# Patient Record
Sex: Female | Born: 2000
Health system: Southern US, Community
[De-identification: ages and names within clinical notes are randomized; demographics above are authoritative.]

## PROBLEM LIST (undated history)

## (undated) DIAGNOSIS — T8859XA Other complications of anesthesia, initial encounter: Secondary | ICD-10-CM

## (undated) DIAGNOSIS — T783XXA Angioneurotic edema, initial encounter: Secondary | ICD-10-CM

## (undated) HISTORY — DX: Angioneurotic edema, initial encounter: T78.3XXA

---
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
## 2018-07-03 DIAGNOSIS — Z23 Encounter for immunization: Secondary | ICD-10-CM | POA: Diagnosis not present

## 2018-07-03 DIAGNOSIS — Z0000 Encounter for general adult medical examination without abnormal findings: Secondary | ICD-10-CM | POA: Diagnosis not present

## 2019-07-25 ENCOUNTER — Other Ambulatory Visit: Payer: Self-pay

## 2019-07-25 DIAGNOSIS — K624 Stenosis of anus and rectum: Secondary | ICD-10-CM

## 2019-07-26 LAB — NOVEL CORONAVIRUS, NAA: SARS-CoV-2, NAA: NOT DETECTED

## 2019-07-27 ENCOUNTER — Telehealth: Payer: Self-pay | Admitting: General Practice

## 2019-07-27 NOTE — Telephone Encounter (Signed)
Negative COVID results given. Patient results "NOT Detected." Caller expressed understanding. ° °

## 2019-09-28 HISTORY — PX: WISDOM TOOTH EXTRACTION: SHX21

## 2019-09-28 HISTORY — PX: BUNIONECTOMY: SHX129

## 2020-02-20 ENCOUNTER — Other Ambulatory Visit: Payer: Self-pay

## 2020-02-20 ENCOUNTER — Encounter: Payer: Self-pay | Admitting: Podiatry

## 2020-02-20 ENCOUNTER — Ambulatory Visit (INDEPENDENT_AMBULATORY_CARE_PROVIDER_SITE_OTHER): Payer: 59

## 2020-02-20 ENCOUNTER — Ambulatory Visit: Payer: 59 | Admitting: Podiatry

## 2020-02-20 VITALS — Temp 97.8°F

## 2020-02-20 DIAGNOSIS — M21619 Bunion of unspecified foot: Secondary | ICD-10-CM

## 2020-02-20 DIAGNOSIS — Q666 Other congenital valgus deformities of feet: Secondary | ICD-10-CM | POA: Diagnosis not present

## 2020-02-20 DIAGNOSIS — M79671 Pain in right foot: Secondary | ICD-10-CM | POA: Diagnosis not present

## 2020-02-20 DIAGNOSIS — M21612 Bunion of left foot: Secondary | ICD-10-CM | POA: Diagnosis not present

## 2020-02-20 DIAGNOSIS — M21611 Bunion of right foot: Secondary | ICD-10-CM | POA: Diagnosis not present

## 2020-02-23 ENCOUNTER — Encounter: Payer: Self-pay | Admitting: Podiatry

## 2020-02-25 ENCOUNTER — Encounter: Payer: Self-pay | Admitting: Podiatry

## 2020-02-25 NOTE — Progress Notes (Signed)
Subjective:  Patient ID: Debra Boyer, female    DOB: 30-Sep-2000,  MRN: [id number]  Chief Complaint  Patient presents with  . Foot Problem    R dorsal midfoot. x1 month. Pt stated, "I woke up one morning and noticed that the area was painful and swollen. Pain varies with activity - it can be 6/10".  . Foot Pain    Bilateral plantar midfoot and medial forefoot (submet 1). 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 Difficult to wear shoes".    19 y.o. female presents with the above complaint.  Patient presents with complaints of bilateral bunion pain that has been causing her a lot of pain.  Patient states that his pain is 6 out of 10 dull but varies with activities.  She states the bump on the side of the foot hurts her a lot.  It is difficult to wear shoes.  She states the pain is severe after work.  She would like to discuss what her treatment options.  She denies any other acute complaints.  She has not seen anyone else prior to seeing me.  She has not tried any other conservative therapy including orthotics.  Review of Systems: Negative except as noted in the HPI. Denies N/V/F/Ch.  No past medical history on file.  Current Outpatient Medications:  .  FLUoxetine (PROZAC) 20 MG capsule, Take 20 mg by mouth daily., Disp: , Rfl:   Social History   Tobacco Use  Smoking Status Never Smoker  Smokeless Tobacco Never Used    Not on File Objective:   Vitals:   02/20/20 0845  Temp: 97.8 F (36.6 C)   There is no height or weight on file to calculate BMI. Constitutional Well developed. Well nourished.  Vascular Dorsalis pedis pulses palpable bilaterally. Posterior tibial pulses palpable bilaterally. Capillary refill normal to all digits.  No cyanosis or clubbing noted. Pedal hair growth normal.  Neurologic Normal speech. Oriented to person, place, and time. Epicritic sensation to light touch grossly present bilaterally.  Dermatologic Nails well  groomed and normal in appearance. No open wounds. No skin lesions.  Orthopedic: Normal joint ROM without pain or crepitus bilaterally. Hallux abductovalgus deformity present Left 1st MPJ diminished range of motion. Left 1st TMT without gross hypermobility. Right 1st MPJ diminished range of motion  Right 1st TMT without gross hypermobility. Lesser digital contractures absent bilaterally.   Radiographs: Taken and reviewed. 3 views of skeletally mature adult bilateral foot with the right greater than left moderate bunion deformity noted bilaterally.  There is increase in intermetatarsal angle bilaterally there is any increasing hallux valgus angles.  Sesamoid position is 6 out of 7.  No elevatus present bilaterally.  There is mild decreasing calcaneal clinician angle increase in talar declination angle anterior break in cyma line no elevatus present.  These findings are consistent for pes planus.  Metatarsal parabola is intact bilaterally  Assessment:   1. Bunion   2. Right foot pain    Plan:  Patient was evaluated and treated and all questions answered.  Hallux abductovalgus deformity bilaterally with right greater than left,  -XR as above. -I explained patient the etiology of bunion deformity and various treatment options were extensively discussed.  I explained to the patient that the underlying driving force for bunions are pes planus deformity.  Patient states understanding.  I believe she will benefit from custom-made orthotics to help address the pes planus.  I also discussed with her briefly the surgical  management of bunions and if her pain continues to get worse will discuss surgical management of bunion deformity with right side before the left side.  Patient will be scheduled to see [facility].  Return if symptoms worsen or fail to improve, for See Liliane Channel for orthotics ASAP.

## 2020-02-26 ENCOUNTER — Other Ambulatory Visit: Payer: Self-pay | Admitting: Podiatry

## 2020-02-26 DIAGNOSIS — M21619 Bunion of unspecified foot: Secondary | ICD-10-CM

## 2020-03-10 ENCOUNTER — Ambulatory Visit: Payer: 59 | Admitting: Orthotics

## 2020-03-10 ENCOUNTER — Other Ambulatory Visit: Payer: Self-pay

## 2020-03-10 DIAGNOSIS — M2141 Flat foot [pes planus] (acquired), right foot: Secondary | ICD-10-CM

## 2020-03-10 DIAGNOSIS — Q666 Other congenital valgus deformities of feet: Secondary | ICD-10-CM

## 2020-03-10 DIAGNOSIS — M21619 Bunion of unspecified foot: Secondary | ICD-10-CM

## 2020-03-10 DIAGNOSIS — M79671 Pain in right foot: Secondary | ICD-10-CM

## 2020-03-10 DIAGNOSIS — M2142 Flat foot [pes planus] (acquired), left foot: Secondary | ICD-10-CM

## 2020-03-10 NOTE — Progress Notes (Signed)
Cast today for foot orthotics to address hallux valgus/pes planus condition.  Plan of soft device that will offer RF stability, longitudinal arch support.

## 2020-03-17 ENCOUNTER — Ambulatory Visit: Payer: 59 | Admitting: Podiatry

## 2020-03-17 ENCOUNTER — Other Ambulatory Visit: Payer: Self-pay

## 2020-03-17 VITALS — Temp 97.2°F

## 2020-03-17 DIAGNOSIS — M2011 Hallux valgus (acquired), right foot: Secondary | ICD-10-CM

## 2020-03-17 DIAGNOSIS — Z01818 Encounter for other preprocedural examination: Secondary | ICD-10-CM | POA: Diagnosis not present

## 2020-03-17 DIAGNOSIS — M79671 Pain in right foot: Secondary | ICD-10-CM

## 2020-03-18 ENCOUNTER — Encounter: Payer: Self-pay | Admitting: Podiatry

## 2020-03-18 NOTE — Progress Notes (Signed)
Subjective:  Patient ID: Debra Boyer, female    DOB: May 19, 2001,  MRN: [id number]  Chief Complaint  Patient presents with  . Follow-up    Pt stated, "Pain is the same". Pt would like to discuss surgery.    19 y.o. female presents with the above complaint.  Patient presents with a follow-up of bilateral bunion deformity with right greater than left.  Patient states her pain has gotten progressively worse.  She states is painful when walking on the surface.  She has tried shoe gear modification which has not helped.  She is working with orthotics as well which has not helped a little bit.  She denies any other acute complaints.  She would like to discuss surgery as she has failed all conservative options.  Review of Systems: Negative except as noted in the HPI. Denies N/V/F/Ch.  No past medical history on file.  Current Outpatient Medications:  .  FLUoxetine (PROZAC) 20 MG capsule, Take 20 mg by mouth daily., Disp: , Rfl:   Social History   Tobacco Use  Smoking Status Never Smoker  Smokeless Tobacco Never Used    Not on File Objective:   Vitals:   03/17/20 1322  Temp: (!) 97.2 F (36.2 C)   There is no height or weight on file to calculate BMI. Constitutional Well developed. Well nourished.  Vascular Dorsalis pedis pulses palpable bilaterally. Posterior tibial pulses palpable bilaterally. Capillary refill normal to all digits.  No cyanosis or clubbing noted. Pedal hair growth normal.  Neurologic Normal speech. Oriented to person, place, and time. Epicritic sensation to light touch grossly present bilaterally.  Dermatologic Nails well groomed and normal in appearance. No open wounds. No skin lesions.  Orthopedic: Normal joint ROM without pain or crepitus bilaterally. Hallux abductovalgus deformity present no intra-articular pain noted.  This is a tracking out of track bound deformity Left 1st MPJ diminished range of motion. Left 1st TMT without gross  hypermobility. Right 1st MPJ diminished range of motion  Right 1st TMT without gross hypermobility. Lesser digital contractures absent bilaterally.   Radiographs: Taken and reviewed. 3 views of skeletally mature adult bilateral foot with the right greater than left moderate bunion deformity noted bilaterally.  There is increase in intermetatarsal angle bilaterally there is any increasing hallux valgus angles.  Sesamoid position is 6 out of 7.  No elevatus present bilaterally.  There is mild decreasing calcaneal clinician angle increase in talar declination angle anterior break in cyma line no elevatus present.  These findings are consistent for pes planus.  Metatarsal parabola is intact bilaterally  Assessment:   1. Hallux abducto valgus, right   2. Right foot pain   3. Preoperative examination    Plan:  Patient was evaluated and treated and all questions answered.  Hallux abductovalgus deformity bilaterally with right greater than left,  -XR as above. -I explained patient the etiology of bunion deformity and various treatment options were extensively discussed.  I explained to the patient that the underlying driving force for bunions are pes planus deformity.  Given that patient's pain is continued to get worse with the underlying bunion deformity I believe patient will benefit from a surgical intervention to help correct the bunion deformity.  I will focus on the right side first and then the left side.  I discussed with the patient my surgical planning as well as my postop protocol.  Patient will be weightbearing as tolerated with a cam boot on. -Informed surgical risk consent was reviewed and read aloud  to the patient.  I reviewed the films.  I have discussed my findings with the patient in great detail.  I have discussed all risks including but not limited to infection, stiffness, scarring, limp, disability, deformity, damage to blood vessels and nerves, numbness, poor healing, need for  braces, arthritis, chronic pain, amputation, death.  All benefits and realistic expectations discussed in great detail.  I have made no promises as to the outcome.  I have provided realistic expectations.  I have offered the patient a 2nd opinion, which they have declined and assured me they preferred to proceed despite the risks -A total of 35 minutes was spent in direct patient care as well as pre and post patient encounter activities.  This includes documentation as well as reviewing patient chart for labs, imaging, past medical, surgical, social, and family history as documented in the EMR.  I have reviewed medication allergies as documented in EMR.  I discussed the etiology of condition and treatment options from conservative to surgical care.  All risks and benefit of the treatment course was discussed in detail.  All questions were answered and return appointment was discussed.  Since the visit completed in an ambulatory/outpatient setting, the patient and/or parent/guardian has been advised to contact the providers office for worsening condition and seek medical treatment and/or call 911 if the patient deems either is necessary.

## 2020-03-21 ENCOUNTER — Telehealth: Payer: Self-pay

## 2020-03-21 NOTE — Telephone Encounter (Signed)
DOS 04/14/2020  [place] BUNIONECTOMY RT - [id number] CORRECTION BUNION BY PHALANX OSTEOTOMY RT - [id number]  [facility] EFFECTIVE DATE - 09/28/19  PLAN DEDUCTIBLE - $1500.00 P$5361.44 REMAINING OUT OF POCKET - $3750.00 W/ $3238.13 REMAINING COPAY $0.00 COINSURANCE - 20%   NOTIFICATION/PRIOR AUTHORIZATION NUMBER CASE STATUS CASE STATUS REASON PRIMARY CARE PHYSICIAN [id number] Closed Case Was Managed And Is Now Complete - ADVANCE NOTIFY DATE/TIME ADMISSION NOTIFY DATE/TIME 03/20/2020 01:30 PM CDT - COVERAGE STATUS OVERALL COVERAGE STATUS Covered/Approved 1-2 CODE DESCRIPTION COVERAGE STATUS DECISION DATE FAC [facility] Coverage determination is reflected for the facility admission and is not a guarantee of payment for ongoing services. Covered/Approved 03/21/2020 1 [id number] Correction, hallux valgus (bunionectomy) more Covered/Approved 03/21/2020 2 [id number] Correction, hallux valgus (bunionectomy) more Covered/Approved 03/21/2020

## 2020-03-26 ENCOUNTER — Ambulatory Visit: Payer: 59 | Admitting: Podiatry

## 2020-04-01 ENCOUNTER — Ambulatory Visit (INDEPENDENT_AMBULATORY_CARE_PROVIDER_SITE_OTHER): Payer: 59 | Admitting: Orthotics

## 2020-04-01 ENCOUNTER — Other Ambulatory Visit: Payer: Self-pay

## 2020-04-01 DIAGNOSIS — M79671 Pain in right foot: Secondary | ICD-10-CM

## 2020-04-01 DIAGNOSIS — M2011 Hallux valgus (acquired), right foot: Secondary | ICD-10-CM

## 2020-04-01 DIAGNOSIS — Z01818 Encounter for other preprocedural examination: Secondary | ICD-10-CM

## 2020-04-01 NOTE — Progress Notes (Signed)
Patient came in today to pick up custom made foot orthotics.  The goals were accomplished and the patient reported no dissatisfaction with said orthotics.  Patient was advised of breakin period and how to report any issues. 

## 2020-04-14 ENCOUNTER — Encounter: Payer: Self-pay | Admitting: Podiatry

## 2020-04-14 DIAGNOSIS — M2011 Hallux valgus (acquired), right foot: Secondary | ICD-10-CM | POA: Diagnosis not present

## 2020-04-14 MED ORDER — OXYCODONE-ACETAMINOPHEN 10-325 MG PO TABS: 1.0000 | ORAL_TABLET | Freq: Four times a day (QID) | ORAL | 0 refills | Status: AC | PRN

## 2020-04-14 MED ORDER — IBUPROFEN 800 MG PO TABS: 800.0000 mg | ORAL_TABLET | Freq: Four times a day (QID) | ORAL | 1 refills | Status: AC | PRN

## 2020-04-14 NOTE — Addendum Note (Signed)
Addended by: Nicholes Rough on: 04/14/2020 07:19 AM   Modules accepted: Orders

## 2020-04-15 ENCOUNTER — Telehealth: Payer: Self-pay | Admitting: Podiatry

## 2020-04-15 NOTE — Telephone Encounter (Signed)
Pts mother called stating patient is experiencing severe pain in her surgery foot and does not seem to be getting any relief from her pain medication. Pt believes the bandages might be too tight. DOS 04/14/20.  Please give patient a call for any advice to help with pain.

## 2020-04-15 NOTE — Telephone Encounter (Signed)
I spoke with pt and informed that the ace is put on snug to staunch any post op bleeding and hold out swelling and sometimes after is has done it's job it is too tight. I told pt to remove the boot, open-ended sock and ace wrap, then elevate the foot for 15 minutes, but if the pain worsened then dangle for 15 minutes this being the only time it was okay to dangle the foot, after 15 minutes either way, then place the foot level with the hip and beginning at the toes rewrap the ace gently down the foot and up the leg replace the sock and boot.

## 2020-04-23 ENCOUNTER — Ambulatory Visit (INDEPENDENT_AMBULATORY_CARE_PROVIDER_SITE_OTHER): Payer: 59

## 2020-04-23 ENCOUNTER — Other Ambulatory Visit: Payer: Self-pay

## 2020-04-23 ENCOUNTER — Ambulatory Visit (INDEPENDENT_AMBULATORY_CARE_PROVIDER_SITE_OTHER): Payer: 59 | Admitting: Podiatry

## 2020-04-23 DIAGNOSIS — M2011 Hallux valgus (acquired), right foot: Secondary | ICD-10-CM

## 2020-04-23 DIAGNOSIS — Z9889 Other specified postprocedural states: Secondary | ICD-10-CM

## 2020-04-23 NOTE — Progress Notes (Signed)
Dos 04/14/20 Right [place] Bunionectomy, Correction Bunion by Phalanx osteotomy.

## 2020-04-24 ENCOUNTER — Encounter: Payer: Self-pay | Admitting: Podiatry

## 2020-04-24 NOTE — Progress Notes (Signed)
  Subjective:  Patient ID: Debra Boyer, female    DOB: 20-May-2001,  MRN: [id number]  Chief Complaint  Patient presents with  . Routine Post Op    POV #1 DOS 04/14/20 RT HAV CORRECTION W/POSSIBLE PHALANX OSTEOTOMY    \  19 y.o. female returns for post-op check.  Patient is doing well.  She states that the pain is manageable.  The pain often varies.  Patient has been taking appear meds as needed.  Denies any other acute complaints.  Review of Systems: Negative except as noted in the HPI. Denies N/V/F/Ch.  No past medical history on file.  Current Outpatient Medications:  .  FLUoxetine (PROZAC) 20 MG capsule, Take 20 mg by mouth daily., Disp: , Rfl:  .  ibuprofen (ADVIL) 800 MG tablet, Take 1 tablet (800 mg total) by mouth every 6 (six) hours as needed., Disp: 60 tablet, Rfl: 1  Social History   Tobacco Use  Smoking Status Never Smoker  Smokeless Tobacco Never Used    No Known Allergies Objective:  There were no vitals filed for this visit. There is no height or weight on file to calculate BMI. Constitutional Well developed. Well nourished.  Vascular Foot warm and well perfused. Capillary refill normal to all digits.   Neurologic Normal speech. Oriented to person, place, and time. Epicritic sensation to light touch grossly present bilaterally.  Dermatologic Skin healing well without signs of infection. Skin edges well coapted without signs of infection.  Orthopedic: Tenderness to palpation noted about the surgical site.   Radiographs: 3 views of skeletally mature adult right foot: Good correction alignment noted.  No breaking or loosening of the hardware noted.  No other osseous abnormalities identified. Assessment:   1. Hallux abducto valgus, right   2. Status post foot surgery    Plan:  Patient was evaluated and treated and all questions answered.  S/p foot surgery right -Progressing as expected post-operatively. -XR: See above -WB Status: Weightbearing  as tolerated in cam boot -Sutures: Intact.  No clinical signs of dehiscence noted.  No soft tissue infection noted. -Medications: None -Foot redressed.  No follow-ups on file.

## 2020-05-02 ENCOUNTER — Ambulatory Visit (INDEPENDENT_AMBULATORY_CARE_PROVIDER_SITE_OTHER): Payer: 59 | Admitting: Podiatry

## 2020-05-02 ENCOUNTER — Other Ambulatory Visit: Payer: Self-pay

## 2020-05-02 DIAGNOSIS — M2012 Hallux valgus (acquired), left foot: Secondary | ICD-10-CM | POA: Diagnosis not present

## 2020-05-02 DIAGNOSIS — Z01818 Encounter for other preprocedural examination: Secondary | ICD-10-CM

## 2020-05-03 ENCOUNTER — Encounter: Payer: Self-pay | Admitting: Podiatry

## 2020-05-04 ENCOUNTER — Encounter: Payer: Self-pay | Admitting: Podiatry

## 2020-05-04 NOTE — Progress Notes (Addendum)
Subjective:  Patient ID: Debra Boyer, female    DOB: September 02, 2001,  MRN: [id number]  Chief Complaint  Patient presents with  . Routine Post Op     POV #2 DOS 04/14/20 RT HAV CORRECTION W/POSSIBLE PHALANX OSTEOTOMY. Pt states occasional pain but it is not enough to need medication. Pt denies any concerns and states she is healing well. Denies fever/chills/nausea/vomiting.    \  19 y.o. female returns for post-op check.  Patient is doing well.  She states that the pain is manageable.  The pain often varies.  Patient has been taking appear meds as needed.  Denies any other acute complaints.  Patient is here today to discuss the left-sided bunion as well.  She would like to have that done as soon as possible.  Given that the right side is healing really well.  We will go ahead and schedule the left side as well as discussed previously.  Review of Systems: Negative except as noted in the HPI. Denies N/V/F/Ch.  No past medical history on file.  Current Outpatient Medications:  .  FLUoxetine (PROZAC) 20 MG capsule, Take 20 mg by mouth daily., Disp: , Rfl:  .  ibuprofen (ADVIL) 800 MG tablet, Take 1 tablet (800 mg total) by mouth every 6 (six) hours as needed., Disp: 60 tablet, Rfl: 1  Social History   Tobacco Use  Smoking Status Never Smoker  Smokeless Tobacco Never Used    No Known Allergies Objective:  There were no vitals filed for this visit. There is no height or weight on file to calculate BMI. Constitutional Well developed. Well nourished.  Vascular Foot warm and well perfused. Capillary refill normal to all digits.   Neurologic Normal speech. Oriented to person, place, and time. Epicritic sensation to light touch grossly present bilaterally.  Dermatologic Skin healing well without signs of infection. Skin edges well coapted without signs of infection.  Orthopedic:  None tenderness to palpation noted about the surgical site.  Good range of motion noted at the first  MPJ joint to the right side.  No intra-articular pain. Hallux abductovalgus deformity present no intra-articular pain noted.  This is a tracking out of track bound deformity  Hallux abductovalgus deformity present Left 1st MPJ diminished range of motion. Left 1st TMT without gross hypermobility.   Radiographs: From previous radiographs.  3 views of skeletally mature adult bilateral foot with the right greater than left moderate bunion deformity noted bilaterally.  There is increase in intermetatarsal angle bilaterally there is any increasing hallux valgus angles.  Sesamoid position is 6 out of 7.  No elevatus present bilaterally.  There is mild decreasing calcaneal clinician angle increase in talar declination angle anterior break in cyma line no elevatus present.  These findings are consistent for pes planus.  Metatarsal parabola is intact bilaterally    Assessment:   1. Hallux abducto valgus, left   2. Preoperative examination    Plan:  Patient was evaluated and treated and all questions answered.  S/p foot surgery right -Progressing as expected post-operatively. -XR: See above -WB Status: Weightbearing as tolerated in cam boot -Sutures: Removed.  No clinical signs of dehiscence noted.  No clinical signs of infection noted. -Medications: None -Foot redressed.  Hallux abductor valgus left side -XR as above. -I explained patient the etiology of bunion deformity and various treatment options were extensively discussed.  I explained to the patient that the underlying driving force for bunions are pes planus deformity.  Given that patient's pain is continued  to get worse with the underlying bunion deformity I believe patient will benefit from a surgical intervention to help correct the bunion deformity.  I would not focus on the left side as the right side is primarily healed. I discussed with the patient my surgical planning as well as my postop protocol.  Patient will be weightbearing as  tolerated with a cam boot on. -Informed surgical risk consent was reviewed and read aloud to the patient.I reviewed the films.I have discussed my findings with the patient in great detail.I have discussed all risks including but not limited to infection, stiffness, scarring, limp, disability, deformity, damage to blood vessels and nerves, numbness, poor healing, need for braces, arthritis, chronic pain, amputation, death.All benefits and realistic expectations discussed in great detail.I have made no promises as to the outcome.I have provided realistic expectations.I have offered the patient a 2nd opinion, which they have declined and assured me they preferred to proceed despite the risks -A total of 31 minutes was spent in direct patient care as well as pre and post patient encounter activities.  This includes documentation as well as reviewing patient chart for labs, imaging, past medical, surgical, social, and family history as documented in the EMR.  I have reviewed medication allergies as documented in EMR.  I discussed the etiology of condition and treatment options from conservative to surgical care.  All risks and benefit of the treatment course was discussed in detail.  All questions were answered and return appointment was discussed.  Since the visit completed in an ambulatory/outpatient setting, the patient and/or parent/guardian has been advised to contact the providers office for worsening condition and seek medical treatment and/or call 911 if the patient deems either is necessary.  No follow-ups on file.

## 2020-05-16 ENCOUNTER — Other Ambulatory Visit: Payer: Self-pay

## 2020-05-16 ENCOUNTER — Ambulatory Visit (INDEPENDENT_AMBULATORY_CARE_PROVIDER_SITE_OTHER): Payer: 59

## 2020-05-16 ENCOUNTER — Ambulatory Visit (INDEPENDENT_AMBULATORY_CARE_PROVIDER_SITE_OTHER): Payer: 59 | Admitting: Podiatry

## 2020-05-16 DIAGNOSIS — M2011 Hallux valgus (acquired), right foot: Secondary | ICD-10-CM | POA: Diagnosis not present

## 2020-05-16 DIAGNOSIS — Z9889 Other specified postprocedural states: Secondary | ICD-10-CM

## 2020-05-20 ENCOUNTER — Encounter: Payer: Self-pay | Admitting: Podiatry

## 2020-05-20 NOTE — Progress Notes (Signed)
Subjective:  Patient ID: Debra Boyer, female    DOB: 24-Jan-2001,  MRN: [id number]  Chief Complaint  Patient presents with  . Routine Post Op      POV #3 DOS 04/14/20 RT HAV CORRECTION W/POSSIBLE PHALANX OSTEOTOMY    \  19 y.o. female returns for post-op check.  Patient is doing well.  Patient states her pain is nonexistent.  She states that she is doing good on the right side.  She has transition to regular shoes.  She denies any other acute complaints.  Review of Systems: Negative except as noted in the HPI. Denies N/V/F/Ch.  No past medical history on file.  Current Outpatient Medications:  .  FLUoxetine (PROZAC) 20 MG capsule, Take 20 mg by mouth daily., Disp: , Rfl:  .  ibuprofen (ADVIL) 800 MG tablet, Take 1 tablet (800 mg total) by mouth every 6 (six) hours as needed., Disp: 60 tablet, Rfl: 1  Social History   Tobacco Use  Smoking Status Never Smoker  Smokeless Tobacco Never Used    No Known Allergies Objective:  There were no vitals filed for this visit. There is no height or weight on file to calculate BMI. Constitutional Well developed. Well nourished.  Vascular Foot warm and well perfused. Capillary refill normal to all digits.   Neurologic Normal speech. Oriented to person, place, and time. Epicritic sensation to light touch grossly present bilaterally.  Dermatologic Skin healing well without signs of infection. Skin edges well coapted without signs of infection.  Orthopedic:  None tenderness to palpation noted about the surgical site.  Good range of motion noted at the first MPJ joint to the right side.  No intra-articular pain. Hallux abductovalgus deformity present no intra-articular pain noted.  This is a tracking out of track bound deformity  Hallux abductovalgus deformity present Left 1st MPJ diminished range of motion. Left 1st TMT without gross hypermobility.   Radiographs: From previous radiographs.  3 views of skeletally mature adult  bilateral foot with the right greater than left moderate bunion deformity noted bilaterally.  There is increase in intermetatarsal angle bilaterally there is any increasing hallux valgus angles.  Sesamoid position is 6 out of 7.  No elevatus present bilaterally.  There is mild decreasing calcaneal clinician angle increase in talar declination angle anterior break in cyma line no elevatus present.  These findings are consistent for pes planus.  Metatarsal parabola is intact bilaterally    Assessment:   1. Hallux abducto valgus, right   2. Status post foot surgery    Plan:  Patient was evaluated and treated and all questions answered.  S/p foot surgery right -Progressing as expected post-operatively. -XR: See above -WB Status: Weightbearing as tolerated in regular shoes -Sutures: Removed.  No clinical signs of dehiscence noted.  No clinical signs of infection noted. -Medications: None -Patient has transition to regular shoes.  And has completely healed.  Hallux abductor valgus left side -XR as above. -I explained patient the etiology of bunion deformity and various treatment options were extensively discussed.  I explained to the patient that the underlying driving force for bunions are pes planus deformity.  Given that patient's pain is continued to get worse with the underlying bunion deformity I believe patient will benefit from a surgical intervention to help correct the bunion deformity.  I would not focus on the left side as the right side is primarily healed. I discussed with the patient my surgical planning as well as my postop protocol.  Patient will  be weightbearing as tolerated with a cam boot on. -Informed surgical risk consent was reviewed and read aloud to the patient.I reviewed the films.I have discussed my findings with the patient in great detail.I have discussed all risks including but not limited to infection, stiffness, scarring, limp, disability, deformity, damage to  blood vessels and nerves, numbness, poor healing, need for braces, arthritis, chronic pain, amputation, death.All benefits and realistic expectations discussed in great detail.I have made no promises as to the outcome.I have provided realistic expectations.I have offered the patient a 2nd opinion, which they have declined and assured me they preferred to proceed despite the risks -A total of 31 minutes was spent in direct patient care as well as pre and post patient encounter activities.  This includes documentation as well as reviewing patient chart for labs, imaging, past medical, surgical, social, and family history as documented in the EMR.  I have reviewed medication allergies as documented in EMR.  I discussed the etiology of condition and treatment options from conservative to surgical care.  All risks and benefit of the treatment course was discussed in detail.  All questions were answered and return appointment was discussed.  Since the visit completed in an ambulatory/outpatient setting, the patient and/or parent/guardian has been advised to contact the providers office for worsening condition and seek medical treatment and/or call 911 if the patient deems either is necessary.  No follow-ups on file.

## 2020-05-22 ENCOUNTER — Telehealth: Payer: Self-pay | Admitting: Podiatry

## 2020-05-22 NOTE — Telephone Encounter (Addendum)
DOS: 05/26/2020  [facility] [phone]) Correction Bunion by [facility] Osteotomy Lt [phone])  [employer] Effective: 09/28/2019 - 09/26/2020  Deductible: $1,500 with $1,500 met and $0 remaining. Out of Pocket: $3,750 with $2,532.41 met and $1,217.67 remains. CoInsurance: $20% Copay: $0  [employer] Auth# [id number]

## 2020-05-23 ENCOUNTER — Telehealth: Payer: Self-pay | Admitting: Podiatry

## 2020-05-23 NOTE — Telephone Encounter (Signed)
I called yesterday, I still have not received a call regarding the time of my sx on Monday. I told pt that she would get a call today. I told her if things haven't changed her sx would be at 8 am but they will call and confirm that along with arrival time for pre-op. Apologized for the inconvenience.

## 2020-05-26 DIAGNOSIS — M2012 Hallux valgus (acquired), left foot: Secondary | ICD-10-CM | POA: Diagnosis not present

## 2020-05-27 ENCOUNTER — Encounter: Payer: Self-pay | Admitting: Podiatry

## 2020-05-28 MED ORDER — ONDANSETRON HCL 8 MG PO TABS: 8.0000 mg | ORAL_TABLET | Freq: Three times a day (TID) | ORAL | 0 refills | Status: DC | PRN

## 2020-05-28 NOTE — Addendum Note (Signed)
Addended by: Nicholes Rough on: 05/28/2020 06:36 AM   Modules accepted: Orders

## 2020-06-04 ENCOUNTER — Encounter: Payer: Self-pay | Admitting: Podiatry

## 2020-06-04 ENCOUNTER — Ambulatory Visit (INDEPENDENT_AMBULATORY_CARE_PROVIDER_SITE_OTHER): Payer: 59

## 2020-06-04 ENCOUNTER — Other Ambulatory Visit: Payer: Self-pay

## 2020-06-04 ENCOUNTER — Ambulatory Visit (INDEPENDENT_AMBULATORY_CARE_PROVIDER_SITE_OTHER): Payer: 59 | Admitting: Podiatry

## 2020-06-04 DIAGNOSIS — M2012 Hallux valgus (acquired), left foot: Secondary | ICD-10-CM

## 2020-06-04 DIAGNOSIS — M79672 Pain in left foot: Secondary | ICD-10-CM

## 2020-06-04 DIAGNOSIS — Z9889 Other specified postprocedural states: Secondary | ICD-10-CM

## 2020-06-05 ENCOUNTER — Encounter: Payer: Self-pay | Admitting: Podiatry

## 2020-06-05 NOTE — Progress Notes (Signed)
  Subjective:  Patient ID: Debra Boyer, female    DOB: Sep 13, 2001,  MRN: [id number]  Chief Complaint  Patient presents with  . Routine Post Op    PT stated " i have no concerns " not in any pain denies any fever vomiting chills or fever     DOS: 05/26/2020 Procedure: Left bunionectomy  19 y.o. female returns for post-op check.  Patient states she is doing well.  Her pain is controlled on ibuprofen.  She denies any other acute complaints.  She has been keeping the bandages clean dry and intact she has been ambulating with a cam boot.  Review of Systems: Negative except as noted in the HPI. Denies N/V/F/Ch.  No past medical history on file.  Current Outpatient Medications:  .  FLUoxetine (PROZAC) 20 MG capsule, Take 20 mg by mouth daily., Disp: , Rfl:  .  ibuprofen (ADVIL) 800 MG tablet, Take 1 tablet (800 mg total) by mouth every 6 (six) hours as needed., Disp: 60 tablet, Rfl: 1 .  ondansetron (ZOFRAN) 8 MG tablet, Take 1 tablet (8 mg total) by mouth every 8 (eight) hours as needed for nausea or vomiting (Take it 30 mins before pain medication)., Disp: 20 tablet, Rfl: 0  Social History   Tobacco Use  Smoking Status Never Smoker  Smokeless Tobacco Never Used    No Known Allergies Objective:  There were no vitals filed for this visit. There is no height or weight on file to calculate BMI. Constitutional Well developed. Well nourished.  Vascular Foot warm and well perfused. Capillary refill normal to all digits.   Neurologic Normal speech. Oriented to person, place, and time. Epicritic sensation to light touch grossly present bilaterally.  Dermatologic Skin healing well without signs of infection. Skin edges well coapted without signs of infection.  Orthopedic: Tenderness to palpation noted about the surgical site.   Radiographs: 3 views of skeletally mature the left foot: Good correction alignment noted.  Hardware is intact.  No signs of loosening or backing out  noted.  No other bony pathology is noted. Assessment:   1. Foot pain, left   2. Status post foot surgery   3. Hallux abducto valgus, left    Plan:  Patient was evaluated and treated and all questions answered.  S/p foot surgery right -Progressing as expected post-operatively. -XR: See above -WB Status: Weightbearing as tolerated in cam boot -Sutures: Intact.  No signs of dehiscence noted.  No clinical signs of infection noted. -Medications: None -Foot redressed.  No follow-ups on file.

## 2020-06-11 ENCOUNTER — Other Ambulatory Visit: Payer: Self-pay

## 2020-06-11 ENCOUNTER — Ambulatory Visit (INDEPENDENT_AMBULATORY_CARE_PROVIDER_SITE_OTHER): Payer: 59

## 2020-06-11 ENCOUNTER — Ambulatory Visit (INDEPENDENT_AMBULATORY_CARE_PROVIDER_SITE_OTHER): Payer: 59 | Admitting: Podiatry

## 2020-06-11 ENCOUNTER — Other Ambulatory Visit: Payer: Self-pay | Admitting: Podiatry

## 2020-06-11 DIAGNOSIS — M79672 Pain in left foot: Secondary | ICD-10-CM

## 2020-06-11 DIAGNOSIS — M2022 Hallux rigidus, left foot: Secondary | ICD-10-CM

## 2020-06-11 DIAGNOSIS — M2012 Hallux valgus (acquired), left foot: Secondary | ICD-10-CM

## 2020-06-11 DIAGNOSIS — Z9889 Other specified postprocedural states: Secondary | ICD-10-CM

## 2020-06-13 ENCOUNTER — Encounter: Payer: Self-pay | Admitting: Podiatry

## 2020-06-13 NOTE — Progress Notes (Signed)
  Subjective:  Patient ID: Debra Boyer, female    DOB: 2001-05-12,  MRN: [id number]  Chief Complaint  Patient presents with  . Routine Post Op    PT stated she has no concerns but she does have some sharp pains     DOS: 05/26/2020 Procedure: Left bunionectomy  19 y.o. female returns for post-op check.  Patient states she is doing well.  Her pain is controlled on ibuprofen.  She denies any other acute complaints.  She has been keeping the bandages clean dry and intact she has been ambulating with a cam boot.  Review of Systems: Negative except as noted in the HPI. Denies N/V/F/Ch.  No past medical history on file.  Current Outpatient Medications:  .  FLUoxetine (PROZAC) 20 MG capsule, Take 20 mg by mouth daily., Disp: , Rfl:  .  ibuprofen (ADVIL) 800 MG tablet, Take 1 tablet (800 mg total) by mouth every 6 (six) hours as needed., Disp: 60 tablet, Rfl: 1 .  ondansetron (ZOFRAN) 8 MG tablet, Take 1 tablet (8 mg total) by mouth every 8 (eight) hours as needed for nausea or vomiting (Take it 30 mins before pain medication)., Disp: 20 tablet, Rfl: 0  Social History   Tobacco Use  Smoking Status Never Smoker  Smokeless Tobacco Never Used    No Known Allergies Objective:  There were no vitals filed for this visit. There is no height or weight on file to calculate BMI. Constitutional Well developed. Well nourished.  Vascular Foot warm and well perfused. Capillary refill normal to all digits.   Neurologic Normal speech. Oriented to person, place, and time. Epicritic sensation to light touch grossly present bilaterally.  Dermatologic Skin healing well without signs of infection. Skin edges well coapted without signs of infection.  Orthopedic: Tenderness to palpation noted about the surgical site.   Radiographs: 3 views of skeletally mature the left foot: Good correction alignment noted.  Hardware is intact.  No signs of loosening or backing out noted.  No other bony  pathology is noted. Assessment:   1. Foot pain, left   2. Status post foot surgery   3. Hallux abducto valgus, left    Plan:  Patient was evaluated and treated and all questions answered.  S/p foot surgery right -Progressing as expected post-operatively. -XR: See above -WB Status: Weightbearing as tolerated in cam boot -Sutures: Intact.  No signs of dehiscence noted.  No clinical signs of infection noted. -Medications: None -Foot redressed.  No follow-ups on file.

## 2020-06-25 ENCOUNTER — Other Ambulatory Visit: Payer: Self-pay

## 2020-06-25 ENCOUNTER — Ambulatory Visit (INDEPENDENT_AMBULATORY_CARE_PROVIDER_SITE_OTHER): Payer: 59 | Admitting: Podiatry

## 2020-06-25 DIAGNOSIS — M2012 Hallux valgus (acquired), left foot: Secondary | ICD-10-CM

## 2020-06-25 DIAGNOSIS — Z9889 Other specified postprocedural states: Secondary | ICD-10-CM

## 2020-06-26 ENCOUNTER — Encounter: Payer: Self-pay | Admitting: Podiatry

## 2020-06-26 NOTE — Progress Notes (Signed)
  Subjective:  Patient ID: Debra Boyer, female    DOB: Apr 27, 2001,  MRN: [id number]  Chief Complaint  Patient presents with  . Routine Post Op    POV#2 DOS 8.30.2021 Pt states not much pain, denies fever/nausea/vomiting/chills. Pt is interested in a splint as she is worried it will come back.    DOS: 05/26/2020 Procedure: Left bunionectomy  19 y.o. female returns for post-op check.  Patient states she is doing well.  Her pain is controlled on ibuprofen.  She denies any other acute complaints.  She has been keeping the bandages clean dry and intact she has been ambulating with a cam boot.  Review of Systems: Negative except as noted in the HPI. Denies N/V/F/Ch.  No past medical history on file.  Current Outpatient Medications:  .  FLUoxetine (PROZAC) 20 MG capsule, Take 20 mg by mouth daily., Disp: , Rfl:  .  ibuprofen (ADVIL) 800 MG tablet, Take 1 tablet (800 mg total) by mouth every 6 (six) hours as needed., Disp: 60 tablet, Rfl: 1 .  ondansetron (ZOFRAN) 8 MG tablet, Take 1 tablet (8 mg total) by mouth every 8 (eight) hours as needed for nausea or vomiting (Take it 30 mins before pain medication)., Disp: 20 tablet, Rfl: 0  Social History   Tobacco Use  Smoking Status Never Smoker  Smokeless Tobacco Never Used    No Known Allergies Objective:  There were no vitals filed for this visit. There is no height or weight on file to calculate BMI. Constitutional Well developed. Well nourished.  Vascular Foot warm and well perfused. Capillary refill normal to all digits.   Neurologic Normal speech. Oriented to person, place, and time. Epicritic sensation to light touch grossly present bilaterally.  Dermatologic Skin healing well without signs of infection. Skin edges well coapted without signs of infection.  Orthopedic: Tenderness to palpation noted about the surgical site.   Radiographs: 3 views of skeletally mature the left foot: Good correction alignment noted.   Hardware is intact.  No signs of loosening or backing out noted.  No other bony pathology is noted. Assessment:   1. Status post foot surgery   2. Hallux abducto valgus, left    Plan:  Patient was evaluated and treated and all questions answered.  S/p foot surgery right -Progressing as expected post-operatively. -XR: See above -WB Status: Transition to surgical shoe. -Sutures: Removed.  No signs of dehiscence noted no clinical signs of infection noted. -Medications: None -Foot redressed.  No follow-ups on file.

## 2020-07-09 ENCOUNTER — Ambulatory Visit (INDEPENDENT_AMBULATORY_CARE_PROVIDER_SITE_OTHER): Payer: 59 | Admitting: Podiatry

## 2020-07-09 ENCOUNTER — Ambulatory Visit (INDEPENDENT_AMBULATORY_CARE_PROVIDER_SITE_OTHER): Payer: 59

## 2020-07-09 ENCOUNTER — Other Ambulatory Visit: Payer: Self-pay

## 2020-07-09 DIAGNOSIS — M2012 Hallux valgus (acquired), left foot: Secondary | ICD-10-CM

## 2020-07-09 DIAGNOSIS — Z9889 Other specified postprocedural states: Secondary | ICD-10-CM

## 2020-07-10 ENCOUNTER — Encounter: Payer: Self-pay | Admitting: Podiatry

## 2020-07-10 NOTE — Progress Notes (Signed)
  Subjective:  Patient ID: Debra Boyer, female    DOB: 2001-02-23,  MRN: [id number]  Chief Complaint  Patient presents with  . Routine Post Op    PT stated that she has some pain on the top of her foot and in the heel and the top of her foot is sensitive to the touch     DOS: 05/26/2020 Procedure: Left bunionectomy  19 y.o. female returns for post-op check.  Patient states she is doing well.  Her pain is controlled on ibuprofen.  She denies any other acute complaints.  She has been keeping the bandages clean dry and intact she has been ambulating with a cam boot.  Review of Systems: Negative except as noted in the HPI. Denies N/V/F/Ch.  History reviewed. No pertinent past medical history.  Current Outpatient Medications:  .  FLUoxetine (PROZAC) 20 MG capsule, Take 20 mg by mouth daily., Disp: , Rfl:  .  ibuprofen (ADVIL) 800 MG tablet, Take 1 tablet (800 mg total) by mouth every 6 (six) hours as needed., Disp: 60 tablet, Rfl: 1 .  ondansetron (ZOFRAN) 8 MG tablet, Take 1 tablet (8 mg total) by mouth every 8 (eight) hours as needed for nausea or vomiting (Take it 30 mins before pain medication)., Disp: 20 tablet, Rfl: 0  Social History   Tobacco Use  Smoking Status Never Smoker  Smokeless Tobacco Never Used    No Known Allergies Objective:  There were no vitals filed for this visit. There is no height or weight on file to calculate BMI. Constitutional Well developed. Well nourished.  Vascular Foot warm and well perfused. Capillary refill normal to all digits.   Neurologic Normal speech. Oriented to person, place, and time. Epicritic sensation to light touch grossly present bilaterally.  Dermatologic  skin completely epithelialized without any dehiscence.  Good range of motion noted to the right side.  No further intra-articular pain noted.  Orthopedic: Tenderness to palpation noted about the surgical site.   Radiographs: 3 views of skeletally mature the left foot:  Good correction alignment noted.  Hardware is intact.  No signs of loosening or backing out noted.  No other bony pathology is noted. Assessment:   1. Hallux abducto valgus, left   2. Status post foot surgery    Plan:  Patient was evaluated and treated and all questions answered.  S/p foot surgery left  -Progressing as expected post-operatively. -XR: See above -WB Status: Regular shoes -Sutures: None. -Medications: None -At this time patient's range of motion to the left side has improved wonderfully.  I discussed with the patient that numbness and edema will go away with time.  Patient states understanding at this time patient is officially discharged from my care.  If any foot and ankle issues arise in the future she will come back and see me.  No follow-ups on file.

## 2021-01-23 ENCOUNTER — Other Ambulatory Visit: Payer: Self-pay

## 2021-01-23 ENCOUNTER — Encounter: Payer: Self-pay | Admitting: Allergy

## 2021-01-23 ENCOUNTER — Ambulatory Visit: Payer: 59 | Admitting: Allergy

## 2021-01-23 VITALS — BP 112/70 | HR 73 | Temp 98.4°F | Resp 16 | Ht 63.0 in | Wt 117.4 lb

## 2021-01-23 DIAGNOSIS — Z91040 Latex allergy status: Secondary | ICD-10-CM

## 2021-01-23 DIAGNOSIS — J3089 Other allergic rhinitis: Secondary | ICD-10-CM | POA: Diagnosis not present

## 2021-01-23 DIAGNOSIS — R059 Cough, unspecified: Secondary | ICD-10-CM | POA: Diagnosis not present

## 2021-01-23 DIAGNOSIS — T781XXD Other adverse food reactions, not elsewhere classified, subsequent encounter: Secondary | ICD-10-CM

## 2021-01-23 NOTE — Assessment & Plan Note (Signed)
Peaches, mangoes, almonds and tree nuts sometimes causes lip tingling and throat scratching.  Bananas, avocados, tomatoes and shrimp causes abdominal pains. Does well with lactose free dairy.  You most likely lactose intolerant - take lactaid pill prior to consuming anything with dairy.  Avoid foods that are bothersome.  See handout on oral allergy syndrome.  For mild symptoms you can take over the counter antihistamines such as Benadryl and monitor symptoms closely. If symptoms worsen or if you have severe symptoms including breathing issues, throat closure, significant swelling, whole body hives, severe diarrhea and vomiting, lightheadedness then seek immediate medical care.  Get bloodwork.

## 2021-01-23 NOTE — Assessment & Plan Note (Signed)
Questioning latex sensitivity in condoms as she gets irritations after use.  Check latex IgE level.

## 2021-01-23 NOTE — Progress Notes (Signed)
New Patient Note  RE: Debra Boyer MRN: [id number] DOB: 2001/04/14 Date of Office Visit: 01/23/2021  Consult requested by: Jarrett Soho, PA-C Primary care provider: Jarrett Soho, PA-C  Chief Complaint: Allergic Reaction (Off of all antihastmines (zyrtec) for 10 days  - SOB, Gi upset, itching, swelling to certain foods and environmentals ) and Allergy Testing  History of Present Illness: I had the pleasure of seeing Debra Boyer for initial evaluation at the [facility] on 01/23/2021. She is a 20 y.o. female, who is referred here by Jarrett Soho, PA-C for the evaluation of environmental allergies and possibly food allergies.   Rhinitis: She reports symptoms of nasal congestion, PND, chest congestion, itchy/watery eyes, rhinorrhea, sneezing. Symptoms have been going on for a few years. The symptoms are present all year around with worsening in spring and summer. Other triggers include exposure to dogs. Anosmia: no. Headache: yes. She has used zyrtec, Claritin, benadryl, OTC eye drops with some improvement in symptoms. Sinus infections: no. Previous work up includes: none. Previous ENT evaluation: no. Previous sinus imaging: no. History of nasal polyps: no. Last eye exam: not recently. History of reflux: no.  Assessment and Plan: Debra Boyer is a 20 y.o. female with: Other allergic rhinitis Perennial rhinoconjunctivitis symptoms for years with worsening in the spring and summer.  Tried Zyrtec, Claritin, Benadryl and over-the-counter eyedrops with some benefit.  No prior allergy/ENT evaluation.  Today's skin testing showed: Negative to environmental allergy panel and selects foods including 2 histamine controls questioning the validity of the results.  Get bloodwork as below and will make additional recommendation based on results.  May use over the counter antihistamines such as Zyrtec (cetirizine), Claritin (loratadine), Allegra  (fexofenadine), or Xyzal (levocetirizine) daily as needed. May take twice a day during flares.  Coughing Episodes of coughing, chest congestion and shortness of breath at times.  No prior asthma diagnosis or inhaler use.  Today's spirometry was normal.  M onitor symptoms.   Will make additional recommendations based on bloodwork results.  Other adverse food reactions, not elsewhere classified, subsequent encounter Peaches, mangoes, almonds and tree nuts sometimes causes lip tingling and throat scratching.  Bananas, avocados, tomatoes and shrimp causes abdominal pains. Does well with lactose free dairy.  You most likely lactose intolerant - take lactaid pill prior to consuming anything with dairy.  Avoid foods that are bothersome.  See handout on oral allergy syndrome.  For mild symptoms you can take over the counter antihistamines such as Benadryl and monitor symptoms closely. If symptoms worsen or if you have severe symptoms including breathing issues, throat closure, significant swelling, whole body hives, severe diarrhea and vomiting, lightheadedness then seek immediate medical care.  Get bloodwork.  Latex sensitivity Questioning latex sensitivity in condoms as she gets irritations after use.  Check latex IgE level.  Return in about 2 months (around 03/25/2021).  No orders of the defined types were placed in this encounter.   Lab Orders     Allergens w/Total IgE Area 2     IgE Nut Prof. w/Component Rflx     Milk IgE     Allergen Profile, Shellfish     Tomato IgE     Allergen Avocado f96     Allergen, Banana f92     Allergen Peach f95     Latex, IgE  Other allergy screening: Asthma: no Coughing, chest congestion, shortness of breath at times. No prior asthma diagnosis or inhaler use.   Food allergy: no  Lactose  and gluten causes bloating, and abdominal pains at times.  Tried lactose free dairy with good benefit.  Peaches, mangoes - lip tingling and throat  scratching. This also happens with fruit cups. Almonds and nuts - throat scratching. Bananas, avocados, tomatoes, shrimp - cause some abdominal pains  Sometimes these reactions occurs the following day and not immediately especially the GI symptoms.   Dietary History: patient has been eating other foods including limited lactose free milk, eggs, peanut, treenuts - pistachios, almonds, pecans, sesame, shellfish, fish, soy, wheat, meats, fruits and vegetables.  Medication allergy: no  Concerned if she may be latex sensitive and she tends to get vaginal irritation/UTIs after using latex condoms.  Hymenoptera allergy: no Urticaria: no Eczema:no History of recurrent infections suggestive of immunodeficency: no  Diagnostics: Spirometry:  Tracings reviewed. Her effort: Good reproducible efforts. FVC: 3.46L FEV1: 2.81L, 88% predicted FEV1/FVC ratio: 81% Interpretation: Spirometry consistent with normal pattern.  Please see scanned spirometry results for details.  Skin Testing: Environmental allergy panel and select foods. Negative test to: environmental allergy panel and selects foods including 2 positive controls questioning the validity of the results.  Results discussed with patient/family.  Airborne Adult Perc - 01/23/21 1055    Time Antigen Placed 1055    Allergen Manufacturer Waynette Buttery    Location Back    Number of Test 59    1. Control-Buffer 50% Glycerol Negative    2. Control-Histamine 1 mg/ml Negative    3. Albumin saline Negative    4. Bahia Negative    5. French Southern Territories Negative    6. Johnson Negative    7. Kentucky Blue Negative    8. Meadow Fescue Negative    9. Perennial Rye Negative    10. Sweet Vernal Negative    11. Timothy Negative    12. Cocklebur Negative    13. Burweed Marshelder Negative    14. Ragweed, short Negative    15. Ragweed, Giant Negative    16. Plantain,  English Negative    17. Lamb's Quarters Negative    18. Sheep Sorrell Negative    19. Rough  Pigweed Negative    20. Marsh Elder, Rough Negative    21. Mugwort, Common Negative    22. Ash mix Negative    23. Birch mix Negative    24. Beech American Negative    25. Box, Elder Negative    26. Cedar, red Negative    27. Cottonwood, Guinea-Bissau Negative    28. Elm mix Negative    29. Hickory Negative    30. Maple mix Negative    31. Oak, Guinea-Bissau mix Negative    32. Pecan Pollen Negative    33. Pine mix Negative    34. Sycamore Eastern Negative    35. Walnut, Black Pollen Negative    36. Alternaria alternata Negative    37. Cladosporium Herbarum Negative    38. Aspergillus mix Negative    39. Penicillium mix Negative    40. Bipolaris sorokiniana (Helminthosporium) Negative    41. Drechslera spicifera (Curvularia) Negative    42. Mucor plumbeus Negative    43. Fusarium moniliforme Negative    44. Aureobasidium pullulans (pullulara) Negative    45. Rhizopus oryzae Negative    46. Botrytis cinera Negative    47. Epicoccum nigrum Negative    48. Phoma betae Negative    49. Candida Albicans Negative    50. Trichophyton mentagrophytes Negative    51. Mite, D Farinae  5,000 AU/ml Negative    52.  Mite, D Pteronyssinus  5,000 AU/ml Negative    53. Cat Hair 10,000 BAU/ml Negative    54.  Dog Epithelia Negative    55. Mixed Feathers Negative    56. Horse Epithelia Negative    57. Cockroach, German Negative    58. Mouse Negative    59. Tobacco Leaf Negative          Food Adult Perc - 01/23/21 1000    Time Antigen Placed 1055    Allergen Manufacturer Waynette ButteryGreer    Location Back    Number of allergen test 19    Control-Histamine 1 mg/ml Negative    1. Peanut Negative    3. Wheat Negative    5. Milk, cow Negative    7. Casein Negative    8. Shellfish Mix Negative    10. Cashew Negative    11. Pecan Food Negative    12. Walnut Food Negative    13. Almond Negative    14. Hazelnut Negative    15. Eston[place] nut Negative    16. Coconut Negative    17. Pistachio Negative    25.  Shrimp Negative    42. Tomato Negative    48. Avocado Negative    57. Banana Negative    59. Peach Negative           Past Medical History: Patient Active Problem List   Diagnosis Date Noted  . Other allergic rhinitis 01/23/2021  . Coughing 01/23/2021  . Other adverse food reactions, not elsewhere classified, subsequent encounter 01/23/2021  . Latex sensitivity 01/23/2021   Past Medical History:  Diagnosis Date  . Angio-edema    Past Surgical History: History reviewed. No pertinent surgical history. Medication List:  Current Outpatient Medications  Medication Sig Dispense Refill  . Cranberry 405 MG CAPS See admin instructions.    Marland Kitchen. ibuprofen (ADVIL) 800 MG tablet Take 1 tablet (800 mg total) by mouth every 6 (six) hours as needed. 60 tablet 1  . levonorgestrel (MIRENA, 52 MG,) 20 MCG/DAY IUD     . Multiple Vitamins-Minerals (HAIR SKIN NAILS) CAPS See admin instructions.    Marland Kitchen. FLUoxetine (PROZAC) 20 MG capsule Take 20 mg by mouth daily. (Patient not taking: Reported on 01/23/2021)    . ondansetron (ZOFRAN) 8 MG tablet Take 1 tablet (8 mg total) by mouth every 8 (eight) hours as needed for nausea or vomiting (Take it 30 mins before pain medication). (Patient not taking: Reported on 01/23/2021) 20 tablet 0   No current facility-administered medications for this visit.   Allergies: No Known Allergies Social History: Social History   Socioeconomic History  . Marital status: Single    Spouse name: Not on file  . Number of children: Not on file  . Years of education: Not on file  . Highest education level: Not on file  Occupational History  . Not on file  Tobacco Use  . Smoking status: Never Smoker  . Smokeless tobacco: Never Used  Substance and Sexual Activity  . Alcohol use: Not on file  . Drug use: Not on file  . Sexual activity: Not on file  Other Topics Concern  . Not on file  Social History Narrative  . Not on file   Social Determinants of Health   Financial  Resource Strain: Not on file  Food Insecurity: Not on file  Transportation Needs: Not on file  Physical Activity: Not on file  Stress: Not on file  Social Connections: Not on file   Lives in  a 22 year old house. Smoking: yes  Occupation: Architectural technologist HistorySurveyor, minerals in the house: no Engineer, civil (consulting) in the family room: no Carpet in the bedroom: yes Heating: electric Cooling: central Pet: yes 5 cats  Family History: Family History  Problem Relation Age of Onset  . Asthma Mother   . Allergic rhinitis Mother   . Eczema Brother   . Allergic rhinitis Brother    Review of Systems  Constitutional: Negative for appetite change, chills, fever and unexpected weight change.  HENT: Positive for congestion, postnasal drip, rhinorrhea and sneezing.   Eyes: Positive for itching.  Respiratory: Positive for cough and shortness of breath. Negative for chest tightness and wheezing.   Cardiovascular: Negative for chest pain.  Gastrointestinal: Negative for abdominal pain.  Genitourinary: Negative for difficulty urinating.  Skin: Negative for rash.  Neurological: Positive for headaches.   Objective: BP 112/70   Pulse 73   Temp 98.4 F (36.9 C)   Resp 16   Ht 5\' 3"  (1.6 m)   Wt 117 lb 6.4 oz (53.3 kg)   SpO2 100%   BMI 20.80 kg/m  Body mass index is 20.8 kg/m. Physical Exam Vitals and nursing note reviewed.  Constitutional:      Appearance: Normal appearance. She is well-developed.  HENT:     Head: Normocephalic and atraumatic.     Right Ear: External ear normal.     Left Ear: External ear normal.     Nose: Nose normal.     Mouth/Throat:     Mouth: Mucous membranes are moist.     Pharynx: Oropharynx is clear.  Eyes:     Conjunctiva/sclera: Conjunctivae normal.  Cardiovascular:     Rate and Rhythm: Normal rate and regular rhythm.     Heart sounds: Normal heart sounds. No murmur heard. No friction rub. No gallop.   Pulmonary:     Effort: Pulmonary effort is  normal.     Breath sounds: Normal breath sounds. No wheezing, rhonchi or rales.  Abdominal:     Palpations: Abdomen is soft.  Musculoskeletal:     Cervical back: Neck supple.  Skin:    General: Skin is warm.     Findings: No rash.  Neurological:     Mental Status: She is alert and oriented to person, place, and time.  Psychiatric:        Behavior: Behavior normal.    The plan was reviewed with the patient/family, and all questions/concerned were addressed.  It was my pleasure to see Kursten today and participate in her care. Please feel free to contact me with any questions or concerns.  Sincerely,  Adela Lank, DO Allergy & Immunology  [facility] office: [phone] [facility] office: [phone]

## 2021-01-23 NOTE — Patient Instructions (Addendum)
Today's skin testing showed: Negative to everything including positive control.  Rhinitis:  Get bloodwork:  We are ordering labs, so please allow 1-2 weeks for the results to come back. With the newly implemented Cures Act, the labs might be visible to you at the same time that they become visible to me. However, I will not address the results until all of the results are back, so please be patient.  In the meantime, continue recommendations in your patient instructions, including avoidance measures (if applicable), until you hear from me.  May use over the counter antihistamines such as Zyrtec (cetirizine), Claritin (loratadine), Allegra (fexofenadine), or Xyzal (levocetirizine) daily as needed. May take twice a day during flares.  Will make additional recommendations based on results.   Food:   You most likely lactose intolerant - take lactaid pill prior to consuming anything with dairy.  Avoid foods that are bothersome.  See handout on oral allergy syndrome.  For mild symptoms you can take over the counter antihistamines such as Benadryl and monitor symptoms closely. If symptoms worsen or if you have severe symptoms including breathing issues, throat closure, significant swelling, whole body hives, severe diarrhea and vomiting, lightheadedness then seek immediate medical care.  Breathing:  Normal breathing test today.  Monitor symptoms.   Follow up in 2 months or sooner if needed.

## 2021-01-23 NOTE — Assessment & Plan Note (Addendum)
Episodes of coughing, chest congestion and shortness of breath at times.  No prior asthma diagnosis or inhaler use.  Today's spirometry was normal.  M onitor symptoms.   Will make additional recommendations based on bloodwork results.

## 2021-01-23 NOTE — Assessment & Plan Note (Signed)
Perennial rhinoconjunctivitis symptoms for years with worsening in the spring and summer.  Tried Zyrtec, Claritin, Benadryl and over-the-counter eyedrops with some benefit.  No prior allergy/ENT evaluation.  Today's skin testing showed: Negative to environmental allergy panel and selects foods including 2 histamine controls questioning the validity of the results.  Get bloodwork as below and will make additional recommendation based on results.  May use over the counter antihistamines such as Zyrtec (cetirizine), Claritin (loratadine), Allegra (fexofenadine), or Xyzal (levocetirizine) daily as needed. May take twice a day during flares.

## 2021-01-31 LAB — ALLERGEN PROFILE, SHELLFISH
Clam IgE: <0.1 kU/L
F023-IgE Crab: <0.1 kU/L
F080-IgE Lobster: <0.1 kU/L
F290-IgE Oyster: <0.1 kU/L
Scallop IgE: <0.1 kU/L
Shrimp IgE: <0.1 kU/L

## 2021-01-31 LAB — IGE NUT PROF. W/COMPONENT RFLX
F017-IgE Hazelnut (Filbert): <0.1 kU/L
F018-IgE Brazil Nut: <0.1 kU/L
F020-IgE Almond: <0.1 kU/L
F202-IgE Cashew Nut: <0.1 kU/L
F203-IgE Pistachio Nut: <0.1 kU/L
F256-IgE Walnut: <0.1 kU/L
Macadamia Nut, IgE: <0.1 kU/L
Peanut, IgE: <0.1 kU/L
Pecan Nut IgE: <0.1 kU/L

## 2021-01-31 LAB — ALLERGENS W/TOTAL IGE AREA 2

## 2021-01-31 LAB — ALLERGEN, TOMATO F25: Allergen Tomato, IgE: <0.1 kU/L

## 2021-01-31 LAB — ALLERGEN PEACH F95: Allergen, Peach f95: <0.1 kU/L

## 2021-01-31 LAB — ALLERGEN BANANA: Allergen Banana IgE: <0.1 kU/L

## 2021-01-31 LAB — LATEX, IGE: Latex IgE: <0.1 kU/L

## 2021-01-31 LAB — ALLERGEN MILK: Milk IgE: <0.1 kU/L

## 2021-01-31 LAB — ALLERGEN AVOCADO F96: F096-IgE Avocado: <0.1 kU/L

## 2021-03-25 ENCOUNTER — Ambulatory Visit: Payer: 59 | Admitting: Family Medicine

## 2021-05-15 ENCOUNTER — Ambulatory Visit: Payer: 59 | Admitting: Podiatry

## 2021-06-03 ENCOUNTER — Ambulatory Visit (INDEPENDENT_AMBULATORY_CARE_PROVIDER_SITE_OTHER): Payer: 59

## 2021-06-03 ENCOUNTER — Other Ambulatory Visit: Payer: Self-pay

## 2021-06-03 ENCOUNTER — Ambulatory Visit (INDEPENDENT_AMBULATORY_CARE_PROVIDER_SITE_OTHER): Payer: 59 | Admitting: Podiatry

## 2021-06-03 DIAGNOSIS — S93691A Other sprain of right foot, initial encounter: Secondary | ICD-10-CM

## 2021-06-03 DIAGNOSIS — M2142 Flat foot [pes planus] (acquired), left foot: Secondary | ICD-10-CM

## 2021-06-03 DIAGNOSIS — M79672 Pain in left foot: Secondary | ICD-10-CM | POA: Diagnosis not present

## 2021-06-03 DIAGNOSIS — M79671 Pain in right foot: Secondary | ICD-10-CM

## 2021-06-05 NOTE — Progress Notes (Signed)
Subjective:  Patient ID: Debra Boyer, female    DOB: [birth year],  MRN: [id number]  No chief complaint on file.   20 y.o. female presents with the above complaint.  Patient presents with a new complaint of right plantar fascial bruise/tear.  Patient states that it happened about a month ago.  Patient states the pain is in the arch of her foot.  She states that she was bending down on top of her toe and then eventually had an small pop.  She states it was painful but is getting much better now.  She wanted get eval make sure that there is nothing else going on.  She denies any other acute complaints.   Review of Systems: Negative except as noted in the HPI. Denies N/V/F/Ch.  Past Medical History:  Diagnosis Date   Angio-edema     Current Outpatient Medications:    Cranberry 405 MG CAPS, See admin instructions., Disp: , Rfl:    FLUoxetine (PROZAC) 20 MG capsule, Take 20 mg by mouth daily. (Patient not taking: Reported on 01/23/2021), Disp: , Rfl:    ibuprofen (ADVIL) 800 MG tablet, Take 1 tablet (800 mg total) by mouth every 6 (six) hours as needed., Disp: 60 tablet, Rfl: 1   levonorgestrel (MIRENA, 52 MG,) 20 MCG/DAY IUD, , Disp: , Rfl:    Multiple Vitamins-Minerals (HAIR SKIN NAILS) CAPS, See admin instructions., Disp: , Rfl:    ondansetron (ZOFRAN) 8 MG tablet, Take 1 tablet (8 mg total) by mouth every 8 (eight) hours as needed for nausea or vomiting (Take it 30 mins before pain medication). (Patient not taking: Reported on 01/23/2021), Disp: 20 tablet, Rfl: 0  Social History   Tobacco Use  Smoking Status Never  Smokeless Tobacco Never    No Known Allergies Objective:  There were no vitals filed for this visit. There is no height or weight on file to calculate BMI. Constitutional Well developed. Well nourished.  Vascular Dorsalis pedis pulses palpable bilaterally. Posterior tibial pulses palpable bilaterally. Capillary refill normal to all digits.  No cyanosis or  clubbing noted. Pedal hair growth normal.  Neurologic Normal speech. Oriented to person, place, and time. Epicritic sensation to light touch grossly present bilaterally.  Dermatologic Nails well groomed and normal in appearance. No open wounds. No skin lesions.  Orthopedic: Pain on palpation to the proximal part of the first metatarsophalangeal joint.  Pain along the course of the plantar fascia into the arch.  No ecchymosis noted.  The pain is mild in nature.  No pain with dorsiflexion of the digits.   Radiographs: 3 views of skeletally mature adult bilateral foot: No fractures noted mild bony abnormalities noted.  Of hardware is intact no signs of backing out or loosening noted. Assessment:   1. Rupture of plantar fascia of right foot, initial encounter   2. Foot pain, left    Plan:  Patient was evaluated and treated and all questions answered.  Right plantar fascial tear mild -I explained the patient etiology of tearing and various treatment options were discussed.  Given that her job requires her to be able to go on her tippy toes therefore putting excessive stress to the plantar fascia that could have led to the micro tearing of the plantar fascia and pain.  Given that her pain is improving there is not much to immobilize.  I discussed with her that if her pain gets worse she can put her self in the boot and have her come back and see me.  She states understanding.  No follow-ups on file.

## 2021-06-10 ENCOUNTER — Other Ambulatory Visit: Payer: Self-pay | Admitting: Podiatry

## 2021-06-10 DIAGNOSIS — M2142 Flat foot [pes planus] (acquired), left foot: Secondary | ICD-10-CM

## 2021-08-11 ENCOUNTER — Other Ambulatory Visit: Payer: Self-pay | Admitting: Physician Assistant

## 2021-08-11 DIAGNOSIS — R109 Unspecified abdominal pain: Secondary | ICD-10-CM

## 2021-08-11 DIAGNOSIS — R1011 Right upper quadrant pain: Secondary | ICD-10-CM

## 2021-08-24 ENCOUNTER — Other Ambulatory Visit: Payer: Self-pay

## 2021-08-26 ENCOUNTER — Ambulatory Visit
Admission: RE | Admit: 2021-08-26 | Discharge: 2021-08-26 | Disposition: A | Discharge status: Home / Self Care | Payer: 59 | Source: Ambulatory Visit | Attending: Physician Assistant | Admitting: Physician Assistant

## 2021-08-26 DIAGNOSIS — R109 Unspecified abdominal pain: Secondary | ICD-10-CM

## 2021-08-26 DIAGNOSIS — R1011 Right upper quadrant pain: Secondary | ICD-10-CM

## 2021-09-22 ENCOUNTER — Other Ambulatory Visit: Payer: Self-pay | Admitting: Surgery

## 2021-10-23 ENCOUNTER — Encounter (HOSPITAL_BASED_OUTPATIENT_CLINIC_OR_DEPARTMENT_OTHER): Payer: Self-pay | Admitting: Surgery

## 2021-10-27 MED ORDER — ENSURE PRE-SURGERY PO LIQD: 296.0000 mL | Freq: Once | ORAL | Status: DC

## 2021-10-27 NOTE — Progress Notes (Signed)

## 2021-11-02 NOTE — H&P (Signed)
° °  REFERRING PHYSICIAN: Dawayne Cirri, PA  PROVIDER: Beverlee Nims, MD  MRN: [id number] DOB: 2001/09/05  Subjective   Chief Complaint: Cholelithiasis   History of Present Illness: Debra Boyer is a 21 y.o. female who is seen tas an office consultation at the request of Dr. Donna Christen for evaluation of Cholelithiasis .   This is a pleasant 21 year old female who is accompanied by her mother who presents with abdominal pain since. With nausea and occasional vomiting. The pain is mostly in the right upper quadrant with bloating and is moderate in intensity. Abdomen is well. She underwent an ultrasound showing cholelithiasis. It was otherwise unremarkable. There is a significant family history of gallstones with multiple family members having had cholecystectomies. She is otherwise very healthy without complaints. She denies jaundice.  Review of Systems: A complete review of systems was obtained from the patient. I have reviewed this information and discussed as appropriate with the patient. See HPI as well for other ROS.  ROS   Medical History: Past Medical History:  Diagnosis Date   Anxiety   There is no problem list on file for this patient.  Past Surgical History:  Procedure Laterality Date   BUNIONETTE EXCISION N/A   wisdom teeth removal N/A    No Known Allergies  No current outpatient medications on file prior to visit.   No current facility-administered medications on file prior to visit.   Family History  Problem Relation Age of Onset   High blood pressure (Hypertension) Mother   Diabetes Father    Social History   Tobacco Use  Smoking Status Never  Smokeless Tobacco Never    Social History   Socioeconomic History   Marital status: Single  Tobacco Use   Smoking status: Never   Smokeless tobacco: Never  Vaping Use   Vaping Use: Never used  Substance and Sexual Activity   Alcohol use: Never   Drug use: Never   Objective:    Vitals:   BP: 98/64  Pulse: 78  Temp: 36.7 C (98.1 F)  SpO2: 100%  Weight: 49.6 kg (109 lb 6.4 oz)  Height: 160 cm (5\' 3" )   Body mass index is 19.38 kg/m.  Physical Exam   She appears well on exam today  Her abdomen is soft and nontender. There are no masses. There is no hepatomegaly  Lungs clear CV RRR  Labs, Imaging and Diagnostic Testing: I reviewed her ultrasound and laboratory data  Assessment and Plan:   Diagnoses and all orders for this visit:  Symptomatic cholelithiasis    I reviewed her notes from her primary provider She does have symptomatic cholelithiasis based on her history and ultrasound. Had a long discussion with her mother regarding symptomatic gallstones. Discussed laparoscopic cholecystectomy as well. Procedure in detail and gave her literature regarding this. We discussed the risks which include but is not limited to bleeding, infection injury to surrounding structures, the chance this may not resolve symptoms completely, cardiopulmonary issues, postoperative recovery, etc. They understand and wish to proceed with surgery which will be scheduled.

## 2021-11-02 NOTE — Anesthesia Preprocedure Evaluation (Addendum)
Anesthesia Evaluation  Patient identified by MRN, date of birth, ID band Patient awake    Reviewed: Allergy & Precautions, NPO status , Patient's Chart, lab work & pertinent test results  Airway Mallampati: I  TM Distance: >3 FB Neck ROM: Full    Dental no notable dental hx.    Pulmonary neg pulmonary ROS,    Pulmonary exam normal breath sounds clear to auscultation       Cardiovascular negative cardio ROS Normal cardiovascular exam Rhythm:Regular Rate:Normal     Neuro/Psych negative neurological ROS  negative psych ROS   GI/Hepatic negative GI ROS, Neg liver ROS,   Endo/Other  negative endocrine ROS  Renal/GU negative Renal ROS  negative genitourinary   Musculoskeletal negative musculoskeletal ROS (+)   Abdominal   Peds negative pediatric ROS (+)  Hematology negative hematology ROS (+)   Anesthesia Other Findings Angioedema and latex sensitivity noted on PMH (patient has not recollection of either of these or why they're on her chart)   Reproductive/Obstetrics negative OB ROS                            Anesthesia Physical Anesthesia Plan  ASA: 1  Anesthesia Plan: General   Post-op Pain Management: Tylenol PO (pre-op) and Toradol IV (intra-op)   Induction: Intravenous  PONV Risk Score and Plan: 3 and Treatment may vary due to age or medical condition, Midazolam, Scopolamine patch - Pre-op, Ondansetron and Dexamethasone  Airway Management Planned: Oral ETT  Additional Equipment: None  Intra-op Plan:   Post-operative Plan: Extubation in OR  Informed Consent: I have reviewed the patients History and Physical, chart, labs and discussed the procedure including the risks, benefits and alternatives for the proposed anesthesia with the patient or authorized representative who has indicated his/her understanding and acceptance.     Dental advisory given  Plan Discussed with:  Anesthesiologist, CRNA and Surgeon  Anesthesia Plan Comments:        Anesthesia Quick Evaluation

## 2021-11-03 ENCOUNTER — Other Ambulatory Visit: Payer: Self-pay

## 2021-11-03 ENCOUNTER — Encounter (HOSPITAL_BASED_OUTPATIENT_CLINIC_OR_DEPARTMENT_OTHER)
Admission: RE | Disposition: A | Discharge status: Home / Self Care | Payer: Self-pay | Source: Ambulatory Visit | Attending: Surgery

## 2021-11-03 ENCOUNTER — Encounter (HOSPITAL_BASED_OUTPATIENT_CLINIC_OR_DEPARTMENT_OTHER): Payer: Self-pay | Admitting: Surgery

## 2021-11-03 ENCOUNTER — Ambulatory Visit (HOSPITAL_BASED_OUTPATIENT_CLINIC_OR_DEPARTMENT_OTHER): Payer: 59 | Admitting: Certified Registered"

## 2021-11-03 ENCOUNTER — Ambulatory Visit (HOSPITAL_BASED_OUTPATIENT_CLINIC_OR_DEPARTMENT_OTHER)
Admission: RE | Admit: 2021-11-03 | Discharge: 2021-11-03 | Disposition: A | Discharge status: Home / Self Care | Payer: 59 | Source: Ambulatory Visit | Attending: Surgery | Admitting: Surgery

## 2021-11-03 DIAGNOSIS — K8010 Calculus of gallbladder with chronic cholecystitis without obstruction: Secondary | ICD-10-CM | POA: Diagnosis not present

## 2021-11-03 HISTORY — DX: Other complications of anesthesia, initial encounter: T88.59XA

## 2021-11-03 HISTORY — PX: CHOLECYSTECTOMY: SHX55

## 2021-11-03 LAB — POCT PREGNANCY, URINE: Preg Test, Ur: NEGATIVE

## 2021-11-03 SURGERY — LAPAROSCOPIC CHOLECYSTECTOMY
Anesthesia: General | Site: Abdomen

## 2021-11-03 MED ORDER — FENTANYL CITRATE (PF) 100 MCG/2ML IJ SOLN
INTRAMUSCULAR | Status: AC
  Filled 2021-11-03: qty 2

## 2021-11-03 MED ORDER — MIDAZOLAM HCL 5 MG/5ML IJ SOLN
INTRAMUSCULAR | Status: DC | PRN
  Administered 2021-11-03: 2 mg via INTRAVENOUS

## 2021-11-03 MED ORDER — PROPOFOL 10 MG/ML IV BOLUS
INTRAVENOUS | Status: AC
  Filled 2021-11-03: qty 20

## 2021-11-03 MED ORDER — OXYCODONE HCL 5 MG PO TABS
ORAL_TABLET | ORAL | Status: AC
  Filled 2021-11-03: qty 2

## 2021-11-03 MED ORDER — ROCURONIUM BROMIDE 100 MG/10ML IV SOLN
INTRAVENOUS | Status: DC | PRN
  Administered 2021-11-03: 60 mg via INTRAVENOUS

## 2021-11-03 MED ORDER — FENTANYL CITRATE (PF) 100 MCG/2ML IJ SOLN
INTRAMUSCULAR | Status: DC | PRN
  Administered 2021-11-03: 50 ug via INTRAVENOUS

## 2021-11-03 MED ORDER — KETOROLAC TROMETHAMINE 30 MG/ML IJ SOLN
INTRAMUSCULAR | Status: DC | PRN
  Administered 2021-11-03: 25 mg via INTRAVENOUS

## 2021-11-03 MED ORDER — DEXAMETHASONE SODIUM PHOSPHATE 10 MG/ML IJ SOLN
INTRAMUSCULAR | Status: AC
  Filled 2021-11-03: qty 1

## 2021-11-03 MED ORDER — OXYCODONE HCL 5 MG/5ML PO SOLN: 5.0000 mg | Freq: Once | ORAL | Status: DC | PRN

## 2021-11-03 MED ORDER — HYDROMORPHONE HCL 1 MG/ML IJ SOLN
INTRAMUSCULAR | Status: AC
  Filled 2021-11-03: qty 0.5

## 2021-11-03 MED ORDER — ONDANSETRON HCL 4 MG/2ML IJ SOLN
INTRAMUSCULAR | Status: DC | PRN
  Administered 2021-11-03: 4 mg via INTRAVENOUS

## 2021-11-03 MED ORDER — CHLORHEXIDINE GLUCONATE CLOTH 2 % EX PADS: 6.0000 | MEDICATED_PAD | Freq: Once | CUTANEOUS | Status: DC

## 2021-11-03 MED ORDER — ACETAMINOPHEN 500 MG PO TABS
ORAL_TABLET | ORAL | Status: AC
  Filled 2021-11-03: qty 2

## 2021-11-03 MED ORDER — OXYCODONE HCL 5 MG PO TABS: 5.0000 mg | ORAL_TABLET | Freq: Once | ORAL | Status: DC | PRN

## 2021-11-03 MED ORDER — CEFAZOLIN SODIUM-DEXTROSE 2-4 GM/100ML-% IV SOLN
2.0000 g | INTRAVENOUS | Status: AC
  Administered 2021-11-03: 2 g via INTRAVENOUS

## 2021-11-03 MED ORDER — KETOROLAC TROMETHAMINE 30 MG/ML IJ SOLN
INTRAMUSCULAR | Status: AC
  Filled 2021-11-03: qty 1

## 2021-11-03 MED ORDER — SCOPOLAMINE 1 MG/3DAYS TD PT72
1.0000 | MEDICATED_PATCH | TRANSDERMAL | Status: DC
  Administered 2021-11-03: 1.5 mg via TRANSDERMAL

## 2021-11-03 MED ORDER — MIDAZOLAM HCL 2 MG/2ML IJ SOLN
INTRAMUSCULAR | Status: AC
  Filled 2021-11-03: qty 2

## 2021-11-03 MED ORDER — ROCURONIUM BROMIDE 10 MG/ML (PF) SYRINGE
PREFILLED_SYRINGE | INTRAVENOUS | Status: AC
  Filled 2021-11-03: qty 10

## 2021-11-03 MED ORDER — AMISULPRIDE (ANTIEMETIC) 5 MG/2ML IV SOLN
10.0000 mg | Freq: Once | INTRAVENOUS | Status: AC | PRN
  Administered 2021-11-03: 10 mg via INTRAVENOUS

## 2021-11-03 MED ORDER — AMISULPRIDE (ANTIEMETIC) 5 MG/2ML IV SOLN
INTRAVENOUS | Status: AC
  Filled 2021-11-03: qty 4

## 2021-11-03 MED ORDER — ACETAMINOPHEN 500 MG PO TABS
1000.0000 mg | ORAL_TABLET | ORAL | Status: AC
Start: 2021-11-03 — End: 2021-11-03
  Administered 2021-11-03: 1000 mg via ORAL

## 2021-11-03 MED ORDER — LACTATED RINGERS IV SOLN: INTRAVENOUS | Status: DC

## 2021-11-03 MED ORDER — SUGAMMADEX SODIUM 200 MG/2ML IV SOLN
INTRAVENOUS | Status: DC | PRN
  Administered 2021-11-03: 200 mg via INTRAVENOUS

## 2021-11-03 MED ORDER — HYDROMORPHONE HCL 1 MG/ML IJ SOLN
0.5000 mg | INTRAMUSCULAR | Status: AC | PRN
  Administered 2021-11-03 (×4): 0.5 mg via INTRAVENOUS

## 2021-11-03 MED ORDER — FENTANYL CITRATE (PF) 100 MCG/2ML IJ SOLN
25.0000 ug | INTRAMUSCULAR | Status: DC | PRN
  Administered 2021-11-03 (×2): 50 ug via INTRAVENOUS

## 2021-11-03 MED ORDER — ACETAMINOPHEN 500 MG PO TABS: 1000.0000 mg | ORAL_TABLET | Freq: Once | ORAL | Status: DC

## 2021-11-03 MED ORDER — OXYCODONE HCL 5 MG PO TABS
10.0000 mg | ORAL_TABLET | Freq: Once | ORAL | Status: AC
  Administered 2021-11-03: 10 mg via ORAL

## 2021-11-03 MED ORDER — CEFAZOLIN SODIUM-DEXTROSE 2-4 GM/100ML-% IV SOLN
INTRAVENOUS | Status: AC
  Filled 2021-11-03: qty 100

## 2021-11-03 MED ORDER — ONDANSETRON HCL 4 MG/2ML IJ SOLN
INTRAMUSCULAR | Status: AC
  Filled 2021-11-03: qty 2

## 2021-11-03 MED ORDER — LIDOCAINE 2% (20 MG/ML) 5 ML SYRINGE
INTRAMUSCULAR | Status: AC
  Filled 2021-11-03: qty 5

## 2021-11-03 MED ORDER — DEXAMETHASONE SODIUM PHOSPHATE 10 MG/ML IJ SOLN
INTRAMUSCULAR | Status: DC | PRN
Start: 2021-11-03 — End: 2021-11-03
  Administered 2021-11-03: 10 mg via INTRAVENOUS

## 2021-11-03 MED ORDER — LIDOCAINE 2% (20 MG/ML) 5 ML SYRINGE
INTRAMUSCULAR | Status: DC | PRN
  Administered 2021-11-03: 60 mg via INTRAVENOUS

## 2021-11-03 MED ORDER — OXYCODONE HCL 5 MG PO TABS: 5.0000 mg | ORAL_TABLET | Freq: Four times a day (QID) | ORAL | 0 refills | Status: AC | PRN

## 2021-11-03 MED ORDER — SODIUM CHLORIDE 0.9 % IR SOLN
Status: DC | PRN
  Administered 2021-11-03: 500 mL

## 2021-11-03 MED ORDER — PROPOFOL 10 MG/ML IV BOLUS
INTRAVENOUS | Status: DC | PRN
  Administered 2021-11-03: 130 mg via INTRAVENOUS

## 2021-11-03 MED ORDER — BUPIVACAINE-EPINEPHRINE (PF) 0.5% -1:200000 IJ SOLN
INTRAMUSCULAR | Status: AC
  Filled 2021-11-03: qty 180

## 2021-11-03 MED ORDER — PROMETHAZINE HCL 25 MG/ML IJ SOLN: 6.2500 mg | INTRAMUSCULAR | Status: DC | PRN

## 2021-11-03 MED ORDER — SCOPOLAMINE 1 MG/3DAYS TD PT72
MEDICATED_PATCH | TRANSDERMAL | Status: AC
  Filled 2021-11-03: qty 1

## 2021-11-03 MED ORDER — BUPIVACAINE-EPINEPHRINE (PF) 0.5% -1:200000 IJ SOLN
INTRAMUSCULAR | Status: DC | PRN
  Administered 2021-11-03: 20 mL

## 2021-11-03 SURGICAL SUPPLY — 41 items
ADH SKN CLS APL DERMABOND .7 (GAUZE/BANDAGES/DRESSINGS) ×1
APL PRP STRL LF DISP 70% ISPRP (MISCELLANEOUS) ×1
APPLIER CLIP 5 13 M/L LIGAMAX5 (MISCELLANEOUS) ×2
APR CLP MED LRG 5 ANG JAW (MISCELLANEOUS) ×1
BAG SPEC RTRVL LRG 6X4 10 (ENDOMECHANICALS) ×1
BLADE CLIPPER SURG (BLADE) IMPLANT
CHLORAPREP W/TINT 26 (MISCELLANEOUS) ×2 IMPLANT
CLIP APPLIE 5 13 M/L LIGAMAX5 (MISCELLANEOUS) ×1 IMPLANT
COVER MAYO STAND STRL (DRAPES) IMPLANT
DERMABOND ADVANCED (GAUZE/BANDAGES/DRESSINGS) ×1
DERMABOND ADVANCED .7 DNX12 (GAUZE/BANDAGES/DRESSINGS) ×1 IMPLANT
DRAPE C-ARM 42X72 X-RAY (DRAPES) IMPLANT
ELECT REM PT RETURN 9FT ADLT (ELECTROSURGICAL) ×2
ELECTRODE REM PT RTRN 9FT ADLT (ELECTROSURGICAL) ×1 IMPLANT
GLOVE SURG POLYISO LF SZ7 (GLOVE) ×2 IMPLANT
GLOVE SURG POLYISO LF SZ7.5 (GLOVE) ×1 IMPLANT
GLOVE SURG SIGNA 7.5 PF LTX (GLOVE) ×2 IMPLANT
GLOVE SURG UNDER POLY LF SZ7 (GLOVE) ×2 IMPLANT
GLOVE SURG UNDER POLY LF SZ7.5 (GLOVE) ×1 IMPLANT
GOWN STRL REUS W/ TWL LRG LVL3 (GOWN DISPOSABLE) ×2 IMPLANT
GOWN STRL REUS W/ TWL XL LVL3 (GOWN DISPOSABLE) ×1 IMPLANT
GOWN STRL REUS W/TWL LRG LVL3 (GOWN DISPOSABLE) ×4
GOWN STRL REUS W/TWL XL LVL3 (GOWN DISPOSABLE) ×4
IRRIG SUCT STRYKERFLOW 2 WTIP (MISCELLANEOUS) ×2
IRRIGATION SUCT STRKRFLW 2 WTP (MISCELLANEOUS) ×1 IMPLANT
NS IRRIG 1000ML POUR BTL (IV SOLUTION) ×2 IMPLANT
PACK BASIN DAY SURGERY FS (CUSTOM PROCEDURE TRAY) ×2 IMPLANT
POUCH SPECIMEN RETRIEVAL 10MM (ENDOMECHANICALS) ×2 IMPLANT
SCISSORS LAP 5X35 DISP (ENDOMECHANICALS) ×2 IMPLANT
SET CHOLANGIOGRAPH 5 50 .035 (SET/KITS/TRAYS/PACK) IMPLANT
SET TUBE SMOKE EVAC HIGH FLOW (TUBING) ×2 IMPLANT
SLEEVE ENDOPATH XCEL 5M (ENDOMECHANICALS) ×4 IMPLANT
SLEEVE SCD COMPRESS KNEE MED (STOCKING) ×2 IMPLANT
SPECIMEN JAR SMALL (MISCELLANEOUS) ×2 IMPLANT
SPIKE FLUID TRANSFER (MISCELLANEOUS) ×2 IMPLANT
SUT MON AB 4-0 PC3 18 (SUTURE) ×2 IMPLANT
TOWEL GREEN STERILE FF (TOWEL DISPOSABLE) ×2 IMPLANT
TRAY LAPAROSCOPIC (CUSTOM PROCEDURE TRAY) ×2 IMPLANT
TROCAR XCEL BLUNT TIP 100MML (ENDOMECHANICALS) ×2 IMPLANT
TROCAR XCEL NON-BLD 5MMX100MML (ENDOMECHANICALS) ×2 IMPLANT
TUBE CONNECTING 20X1/4 (TUBING) ×2 IMPLANT

## 2021-11-03 NOTE — Discharge Instructions (Addendum)
[facility] ______[facility] LAPAROSCOPIC SURGERY: POST OP INSTRUCTIONS Always review your discharge instruction sheet given to you by the facility where your surgery was performed. IF YOU HAVE DISABILITY OR FAMILY LEAVE FORMS, YOU MUST BRING THEM TO THE OFFICE FOR PROCESSING.   DO NOT GIVE THEM TO YOUR DOCTOR.  A prescription for pain medication may be given to you upon discharge.  Take your pain medication as prescribed, if needed.  If narcotic pain medicine is not needed, then you may take acetaminophen (Tylenol) or ibuprofen (Advil) as needed. Take your usually prescribed medications unless otherwise directed. If you need a refill on your pain medication, please contact your pharmacy.  They will contact our office to request authorization. Prescriptions will not be filled after 5pm or on week-ends. You should follow a light diet the first few days after arrival home, such as soup and crackers, etc.  Be sure to include lots of fluids daily. Most patients will experience some swelling and bruising in the area of the incisions.  Ice packs will help.  Swelling and bruising can take several days to resolve.  It is common to experience some constipation if taking pain medication after surgery.  Increasing fluid intake and taking a stool softener (such as Colace) will usually help or prevent this problem from occurring.  A mild laxative (Milk of Magnesia or Miralax) should be taken according to package instructions if there are no bowel movements after 48 hours. Unless discharge instructions indicate otherwise, you may remove your bandages 24-48 hours after surgery, and you may shower at that time.  You may have steri-strips (small skin tapes) in place directly over the incision.  These strips should be left on the skin for 7-10 days.  If your surgeon used skin glue on the incision, you may shower in 24 hours.  The glue will flake off over the next 2-3 weeks.  Any sutures or staples will be  removed at the office during your follow-up visit. ACTIVITIES:  You may resume regular (light) daily activities beginning the next day--such as daily self-care, walking, climbing stairs--gradually increasing activities as tolerated.  You may have sexual intercourse when it is comfortable.  Refrain from any heavy lifting or straining until approved by your doctor. You may drive when you are no longer taking prescription pain medication, you can comfortably wear a seatbelt, and you can safely maneuver your car and apply brakes. RETURN TO WORK:  __________________________________________________________ Bonita Quin should see your doctor in the office for a follow-up appointment approximately 2-3 weeks after your surgery.  Make sure that you call for this appointment within a day or two after you arrive home to insure a convenient appointment time. OTHER INSTRUCTIONS: OK TO SHOWER STARTING TOMORROW ICE PACK, TYLENOL, AND IBUPROFEN ALSO FOR PAIN NO LIFTING MORE THAN 15 TO 20 POUNDS FOR 2 WEEKS__________________________________________________________________________________________________________________________ __________________________________________________________________________________________________________________________ WHEN TO CALL YOUR DOCTOR: Fever over 101.0 Inability to urinate Continued bleeding from incision. Increased pain, redness, or drainage from the incision. Increasing abdominal pain  The clinic staff is available to answer your questions during regular business hours.  Please dont hesitate to call and ask to speak to one of the nurses for clinical concerns.  If you have a medical emergency, go to the nearest emergency room or call 911.  A surgeon from [facility] is always on call at the hospital. [address], [place], [place]  [address] ? [address] [address], [place], [place]   [address] [phone] ? [phone] ?  FAX [phone] Web site:  [email or web address]    Post Anesthesia Home Care Instructions  Activity: Get plenty of rest for the remainder of the day. A responsible individual must stay with you for 24 hours following the procedure.  For the next 24 hours, DO NOT: -Drive a car -Advertising copywriter -Drink alcoholic beverages -Take any medication unless instructed by your physician -Make any legal decisions or sign important papers.  Meals: Start with liquid foods such as gelatin or soup. Progress to regular foods as tolerated. Avoid greasy, spicy, heavy foods. If nausea and/or vomiting occur, drink only clear liquids until the nausea and/or vomiting subsides. Call your physician if vomiting continues.  Special Instructions/Symptoms: Your throat may feel dry or sore from the anesthesia or the breathing tube placed in your throat during surgery. If this causes discomfort, gargle with warm salt water. The discomfort should disappear within 24 hours.  If you had a scopolamine patch placed behind your ear for the management of post- operative nausea and/or vomiting:  1. The medication in the patch is effective for 72 hours, after which it should be removed.  Wrap patch in a tissue and discard in the trash. Wash hands thoroughly with soap and water. 2. You may remove the patch earlier than 72 hours if you experience unpleasant side effects which may include dry mouth, dizziness or visual disturbances. 3. Avoid touching the patch. Wash your hands with soap and water after contact with the patch.     No tylenol until after 12:45pm day of surgery, if needed. No ibuprofen/motrin until 4:15pm day of surgery, if needed. Next dose of oxycodone after 3:30pm today, if needed.

## 2021-11-03 NOTE — Interval H&P Note (Signed)
History and Physical Interval Note: no change in H and P  11/03/2021 7:10 AM  Jonne Ply  has presented today for surgery, with the diagnosis of SYMPTOMATIC CHOLELITHIASIS.  The various methods of treatment have been discussed with the patient and family. After consideration of risks, benefits and other options for treatment, the patient has consented to  Procedure(s): LAPAROSCOPIC CHOLECYSTECTOMY (N/A) as a surgical intervention.  The patient's history has been reviewed, patient examined, no change in status, stable for surgery.  I have reviewed the patient's chart and labs.  Questions were answered to the patient's satisfaction.     Abigail Miyamoto

## 2021-11-03 NOTE — Anesthesia Postprocedure Evaluation (Signed)
Anesthesia Post Note  Patient: Debra Boyer  Procedure(s) Performed: LAPAROSCOPIC CHOLECYSTECTOMY (Abdomen)     Patient location during evaluation: PACU Anesthesia Type: General Level of consciousness: awake Pain management: pain level controlled Vital Signs Assessment: post-procedure vital signs reviewed and stable Respiratory status: spontaneous breathing and respiratory function stable Cardiovascular status: stable Postop Assessment: no apparent nausea or vomiting Anesthetic complications: no   No notable events documented.  Last Vitals:  Vitals:   11/03/21 0945 11/03/21 1000  BP: 109/73 126/60  Pulse: 63 (!) 55  Resp: (!) 9 15  Temp:    SpO2: 100% 100%    Last Pain:  Vitals:   11/03/21 1000  TempSrc:   PainSc: 1                  Candra R Bernetha Anschutz

## 2021-11-03 NOTE — Transfer of Care (Signed)
Immediate Anesthesia Transfer of Care Note  Patient: Debra Boyer  Procedure(s) Performed: LAPAROSCOPIC CHOLECYSTECTOMY (Abdomen)  Patient Location: PACU  Anesthesia Type:General  Level of Consciousness: drowsy  Airway & Oxygen Therapy: Patient Spontanous Breathing and Patient connected to face mask oxygen  Post-op Assessment: Report given to RN and Post -op Vital signs reviewed and stable  Post vital signs: Reviewed and stable  Last Vitals:  Vitals Value Taken Time  BP 145/86 11/03/21 0837  Temp    Pulse 87 11/03/21 0840  Resp 18 11/03/21 0840  SpO2 100 % 11/03/21 0840  Vitals shown include unvalidated device data.  Last Pain:  Vitals:   11/03/21 0165  TempSrc: Oral  PainSc: 0-No pain      Patients Stated Pain Goal: 3 (11/03/21 5374)  Complications: No notable events documented.

## 2021-11-03 NOTE — Anesthesia Procedure Notes (Signed)
Procedure Name: Intubation Date/Time: 11/03/2021 7:33 AM Performed by: Lavonia Dana, CRNA Pre-anesthesia Checklist: Patient identified, Emergency Drugs available, Suction available and Patient being monitored Patient Re-evaluated:Patient Re-evaluated prior to induction Oxygen Delivery Method: Circle system utilized Preoxygenation: Pre-oxygenation with 100% oxygen Induction Type: IV induction Ventilation: Mask ventilation without difficulty Laryngoscope Size: Mac and 3 Grade View: Grade I Tube type: Oral Tube size: 7.0 mm Number of attempts: 1 Airway Equipment and Method: Stylet and Bite block Placement Confirmation: ETT inserted through vocal cords under direct vision, positive ETCO2 and breath sounds checked- equal and bilateral Secured at: 22 cm Tube secured with: Tape Dental Injury: Teeth and Oropharynx as per pre-operative assessment

## 2021-11-03 NOTE — Op Note (Signed)
LAPAROSCOPIC CHOLECYSTECTOMY  Procedure Note  Debra Boyer 11/03/2021  Pre-op Diagnosis: SYMPTOMATIC CHOLELITHIASIS     Post-op Diagnosis: SYMPTOMATIC CHOLELITHIASIS  Procedure(s): LAPAROSCOPIC CHOLECYSTECTOMY  Surgeon(s): Coralie Keens, MD  Anesthesia: General  Staff:  Circulator: Izora Ribas, RN Scrub Person: Lorenza Burton, CST  Procedure in detail:  The patient was brought to the operating room, placed supine on the operating table, and a general anesthetic was administered. The hair on the abdominal wall was clipped as was necessary. The abdominal wall was then sterilely prepped and draped. Local anesthetic (Marcaine) was infiltrated in the subumbilical region. A small subumbilical incision was made through the skin, subcutaneous tissue, fascia, and peritoneum entering the peritoneal cavity under direct vision. A pursestring suture of 0 Vicryl was placed around the edges of the fascia. A Hassan trocar was introduced into the peritoneal cavity and a pneumoperitoneum was created by insufflation of carbon dioxide gas. The laparoscope was introduced into the trocar and no underlying bleeding or organ injury was noted. The patient was then placed in the reverse Trendelenburg position with the right side tilted slightly up.  Three more trochars were then placed into the abdominal cavity under laparoscopic vision. One in the epigastric area, and 2 in the right upper quadrant area. The gallbladder was visualized and the fundus was grasped and retracted toward the right shoulder.  The infundibulum was mobilized with dissection close to the gallbladder and retracted laterally. The cystic duct was identified and a window was created around it. The cystic artery was also identified and a window was created around it. The critical view was achieved. A clip was placed at the neck of the gallbladder and three clips placed on the stay side of the cystic duct. The cystic duct was then  divided.   The cystic artery was then clipped and divided. Following this the gallbladder was dissected free from the liver using electrocautery. The gallbladder was then placed in a retrieval bag and removed from the abdominal cavity through the subumbilical incision.  The gallbladder fossa was inspected, irrigated, and bleeding was controlled with electrocautery. Inspection showed that hemostasis was adequate and there was no evidence of bile leak.  The irrigation fluid was evacuated as much as possible.  The subumbilical trocar was removed and the fascial defect was closed by tightening and tying down the pursestring suture under laparoscopic vision.  The remaining trochars were removed and the pneumoperitoneum was released. The skin incisions were closed with 4-0 Monocryl subcuticular stitches. Skin glue was applied.  The procedure was well-tolerated without any apparent complications. The patient was taken to the recovery room in satisfactory condition.  Estimated Blood Loss: 10cc               Specimens: Gallbladder         [name] MD Date: 11/03/2021  Time: 8:28 AM

## 2021-11-04 ENCOUNTER — Encounter (HOSPITAL_BASED_OUTPATIENT_CLINIC_OR_DEPARTMENT_OTHER): Payer: Self-pay | Admitting: Surgery

## 2021-11-04 LAB — SURGICAL PATHOLOGY

## 2022-04-24 IMAGING — US US ABDOMEN COMPLETE
1 series · 14 of 25 positions shown · non-contrast
Comparison: None.

CLINICAL DATA: Right upper quadrant and right flank pain for 2
months

EXAM:
ABDOMEN ULTRASOUND COMPLETE

[Series 1: us abdomen complete · 0.20mm/px · 14 of 92 slices shown]
[im 1/92]
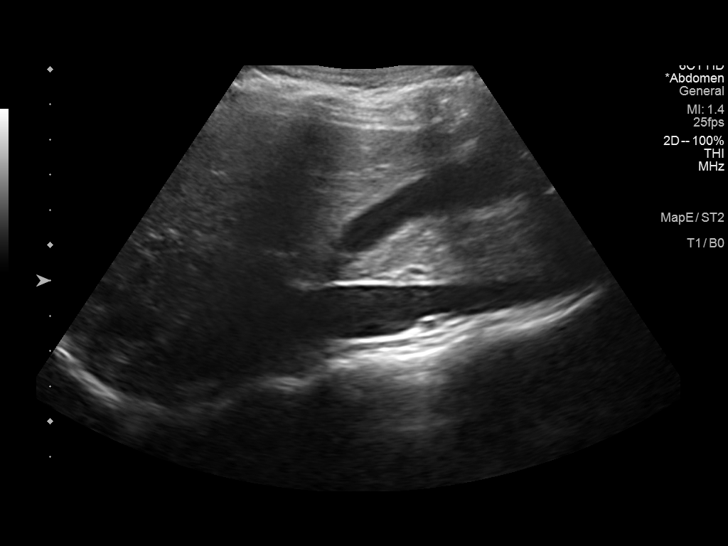
[im 8/92]
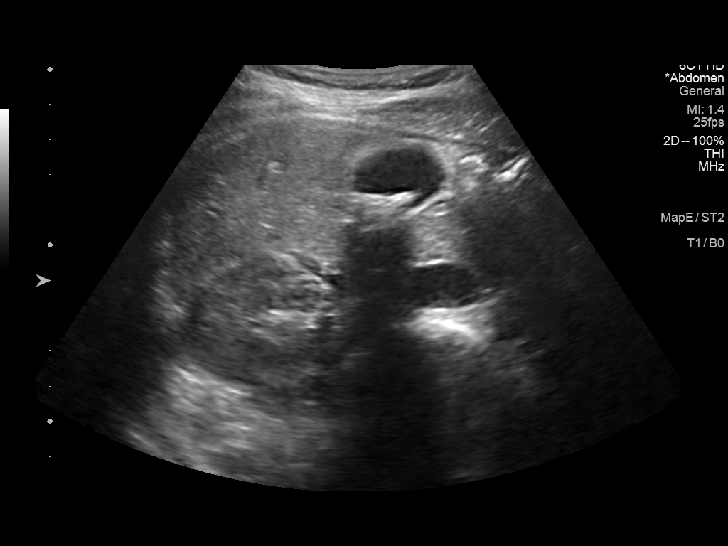
[im 16/92]
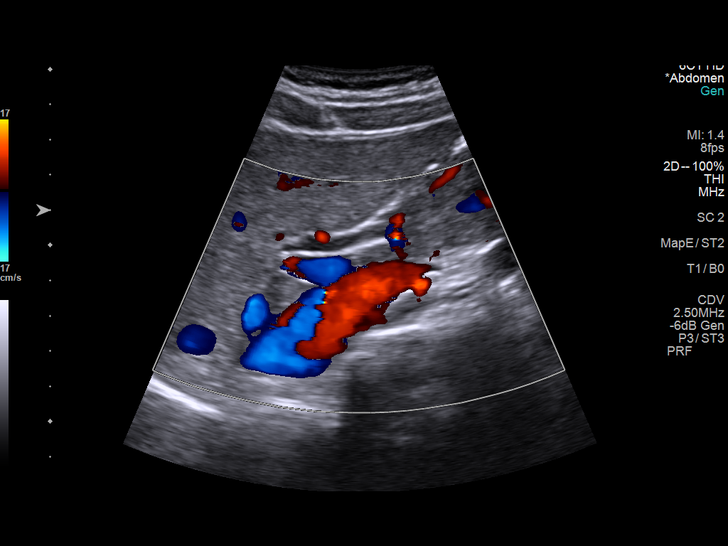
[im 23/92]
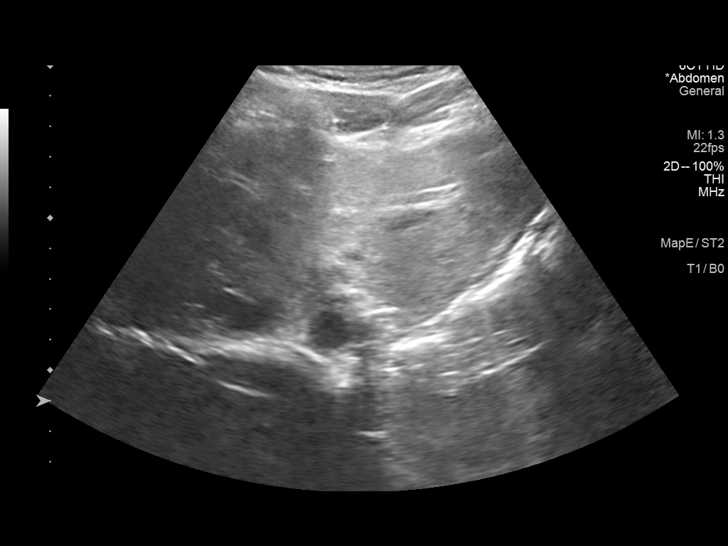
[im 31/92]
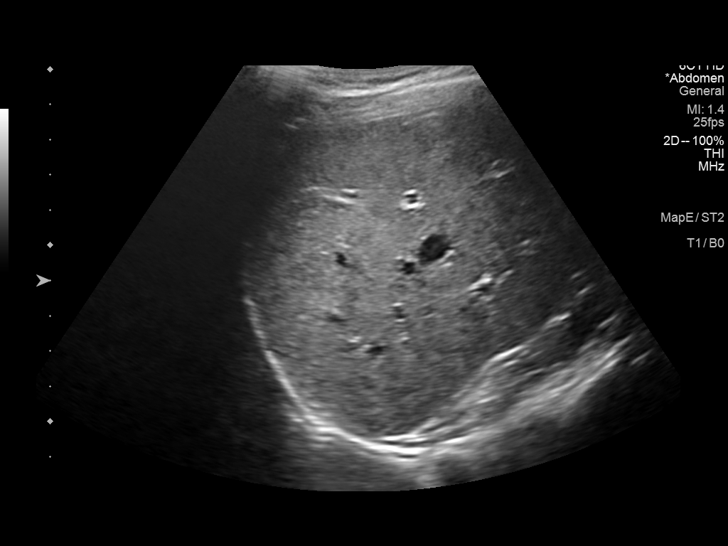
[im 35/92]
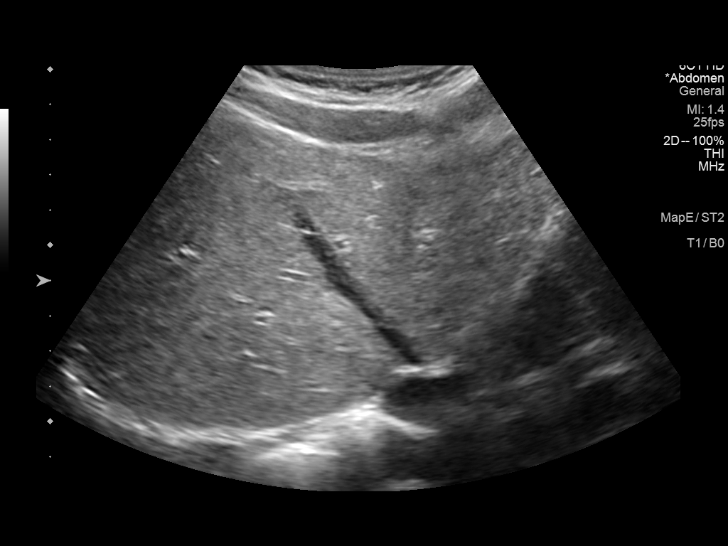
[im 42/92]
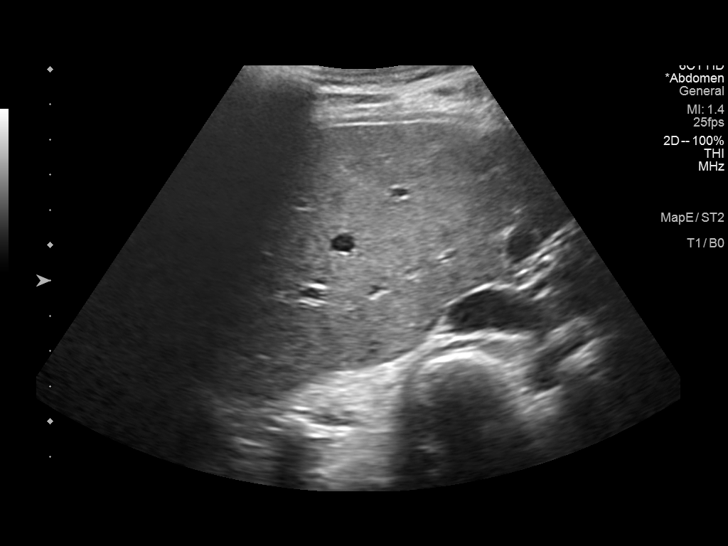
[im 50/92]
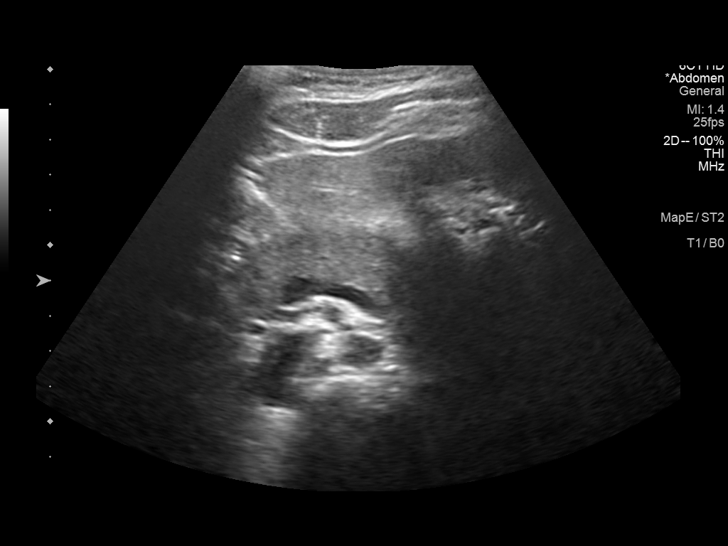
[im 57/92]
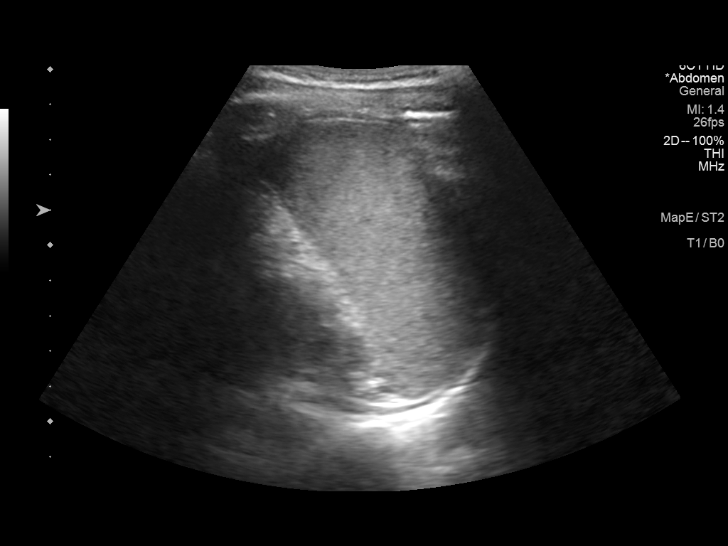
[im 61/92]
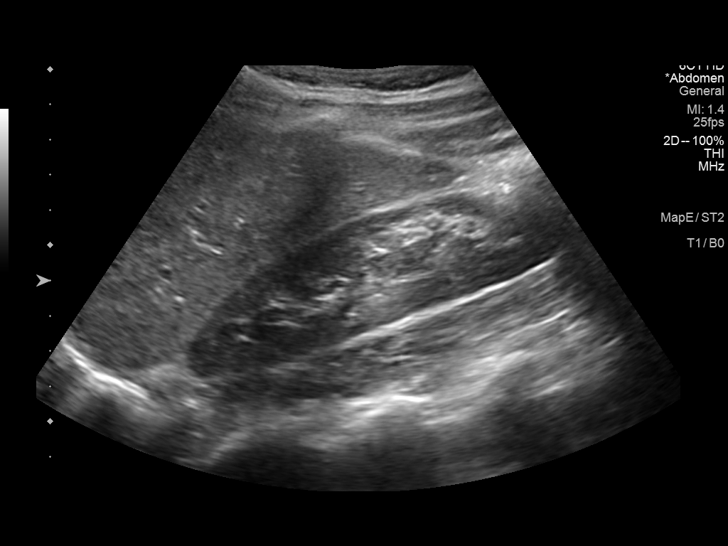
[im 69/92]
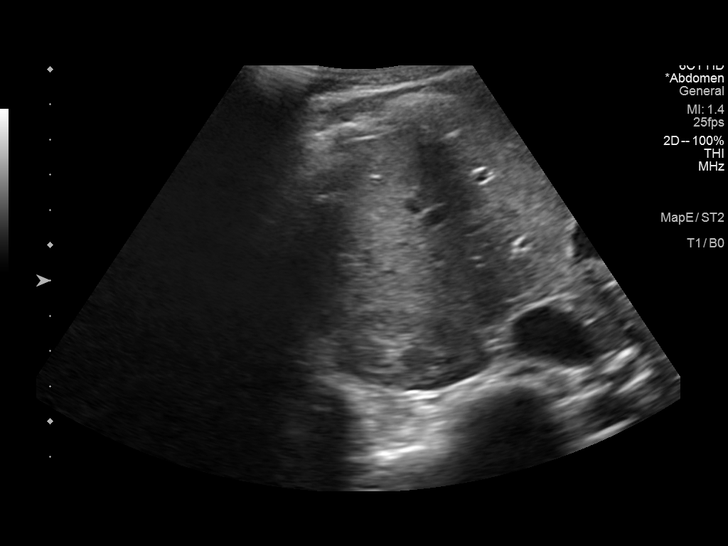
[im 76/92]
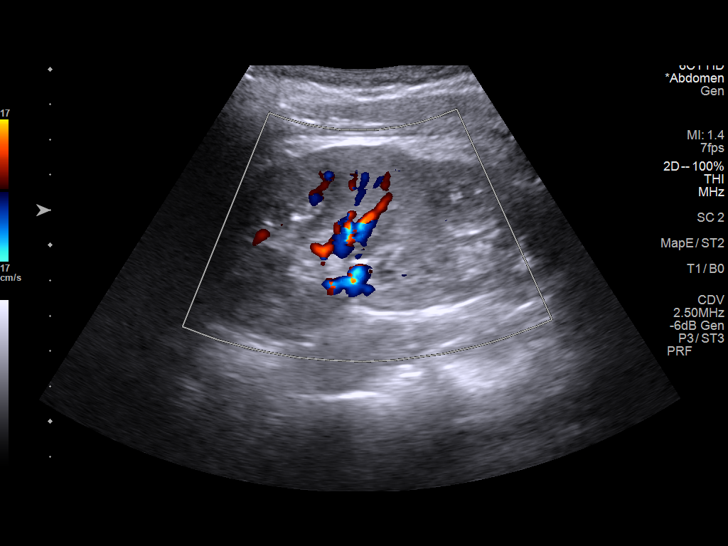
[im 84/92]
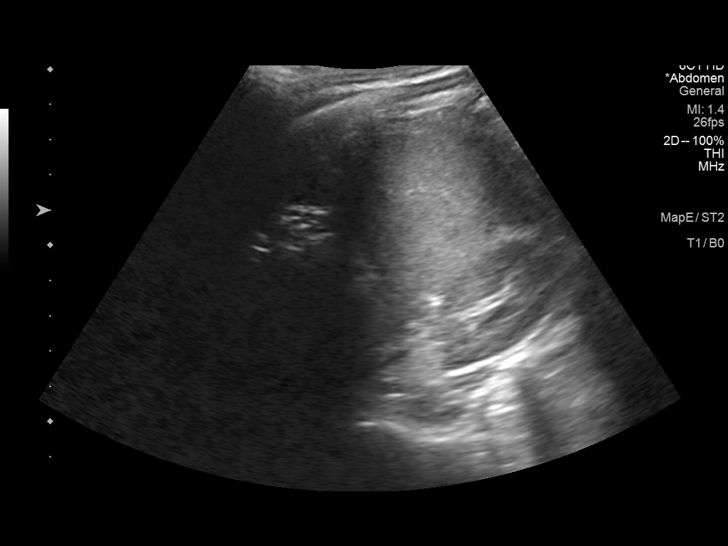
[im 92/92]
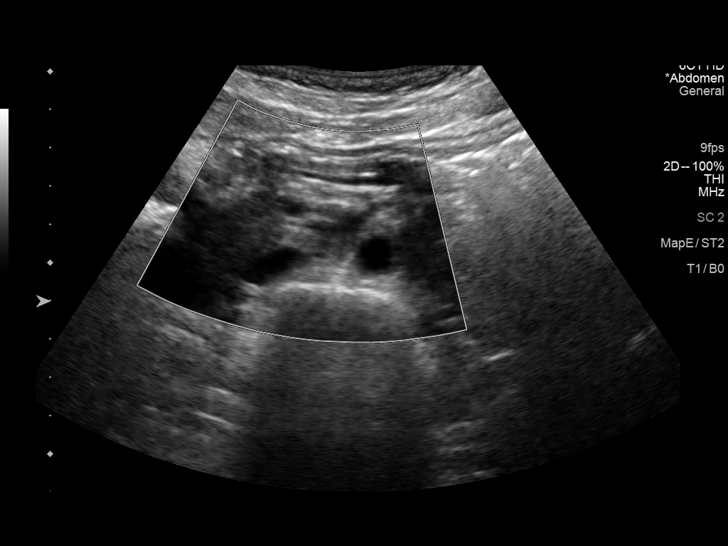

[14 of 25 positions shown; findings below may reference images not displayed]

FINDINGS: Gallbladder: Cholelithiasis. No gallbladder wall thickening or
pericholecystic fluid. Sonographic Murphy sign is negative per
technologist.

Common bile duct: Diameter: 4 mm

Liver: No focal lesion identified. Within normal limits in
parenchymal echogenicity. Portal vein is patent on color Doppler
imaging with normal direction of blood flow towards the liver.

IVC: No abnormality visualized.

Pancreas: Visualized portion unremarkable.

Spleen: Size and appearance within normal limits.

Right Kidney: Length: 11.5 cm. Echogenicity within normal limits. No
mass or hydronephrosis visualized.

Left Kidney: Length: 11.1 cm. Echogenicity within normal limits. No
mass or hydronephrosis visualized.

Abdominal aorta: No aneurysm visualized.

Other findings: None.
IMPRESSION: Cholelithiasis without additional sonographic evidence of acute
cholecystitis.

## 2023-03-07 ENCOUNTER — Other Ambulatory Visit (HOSPITAL_BASED_OUTPATIENT_CLINIC_OR_DEPARTMENT_OTHER): Payer: Self-pay | Admitting: Family Medicine

## 2023-03-07 ENCOUNTER — Ambulatory Visit (HOSPITAL_BASED_OUTPATIENT_CLINIC_OR_DEPARTMENT_OTHER)
Admission: RE | Admit: 2023-03-07 | Discharge: 2023-03-07 | Disposition: A | Discharge status: Home / Self Care | Payer: 59 | Source: Ambulatory Visit | Attending: Family Medicine | Admitting: Family Medicine

## 2023-03-07 DIAGNOSIS — R221 Localized swelling, mass and lump, neck: Secondary | ICD-10-CM | POA: Diagnosis present
